# Patient Record
Sex: Female | Born: 1980
Health system: Southern US, Community
[De-identification: ages and names within clinical notes are randomized; demographics above are authoritative.]

## PROBLEM LIST (undated history)

## (undated) ENCOUNTER — Ambulatory Visit (HOSPITAL_COMMUNITY): Disposition: A | Discharge status: Home / Self Care | Payer: BC Managed Care – PPO

## (undated) DIAGNOSIS — D573 Sickle-cell trait: Secondary | ICD-10-CM

## (undated) DIAGNOSIS — D649 Anemia, unspecified: Secondary | ICD-10-CM

## (undated) DIAGNOSIS — O24419 Gestational diabetes mellitus in pregnancy, unspecified control: Secondary | ICD-10-CM

## (undated) DIAGNOSIS — E119 Type 2 diabetes mellitus without complications: Secondary | ICD-10-CM

## (undated) DIAGNOSIS — O169 Unspecified maternal hypertension, unspecified trimester: Secondary | ICD-10-CM

## (undated) HISTORY — PX: DILATION AND CURETTAGE OF UTERUS: SHX78

## (undated) HISTORY — DX: Type 2 diabetes mellitus without complications: E11.9

---
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
## 2003-01-10 ENCOUNTER — Other Ambulatory Visit: Admission: RE | Admit: 2003-01-10 | Discharge: 2003-01-10 | Payer: Self-pay | Admitting: Obstetrics & Gynecology

## 2004-02-06 ENCOUNTER — Other Ambulatory Visit: Admission: RE | Admit: 2004-02-06 | Discharge: 2004-02-06 | Payer: Self-pay | Admitting: Obstetrics & Gynecology

## 2006-09-27 ENCOUNTER — Encounter (INDEPENDENT_AMBULATORY_CARE_PROVIDER_SITE_OTHER): Payer: Self-pay | Admitting: Specialist

## 2006-09-27 ENCOUNTER — Ambulatory Visit (HOSPITAL_COMMUNITY): Admission: RE | Admit: 2006-09-27 | Discharge: 2006-09-27 | Payer: Self-pay | Admitting: Obstetrics and Gynecology

## 2007-08-12 ENCOUNTER — Ambulatory Visit: Payer: Self-pay | Admitting: Obstetrics & Gynecology

## 2007-08-12 ENCOUNTER — Ambulatory Visit (HOSPITAL_COMMUNITY): Admission: AD | Admit: 2007-08-12 | Discharge: 2007-08-13 | Payer: Self-pay | Admitting: Obstetrics & Gynecology

## 2007-08-12 ENCOUNTER — Encounter: Payer: Self-pay | Admitting: Obstetrics & Gynecology

## 2007-10-03 ENCOUNTER — Ambulatory Visit: Payer: Self-pay | Admitting: Obstetrics and Gynecology

## 2007-10-03 ENCOUNTER — Encounter: Payer: Self-pay | Admitting: Obstetrics and Gynecology

## 2008-03-12 ENCOUNTER — Emergency Department (HOSPITAL_COMMUNITY): Admission: EM | Admit: 2008-03-12 | Discharge: 2008-03-12 | Payer: Self-pay | Admitting: Family Medicine

## 2008-05-22 ENCOUNTER — Emergency Department (HOSPITAL_COMMUNITY): Admission: EM | Admit: 2008-05-22 | Discharge: 2008-05-22 | Payer: Self-pay | Admitting: Emergency Medicine

## 2010-03-10 ENCOUNTER — Encounter: Payer: Self-pay | Admitting: *Deleted

## 2010-03-15 ENCOUNTER — Emergency Department (HOSPITAL_COMMUNITY): Admission: EM | Admit: 2010-03-15 | Discharge: 2010-03-15 | Payer: Self-pay | Admitting: Family Medicine

## 2010-12-28 NOTE — Miscellaneous (Signed)
Summary: Do Not Reschedule  Missed NP appt.  Per [facility] policy is not allowed to reschedule.  Dennison Nancy RN  March 10, 2010 1:39 PM

## 2011-04-12 NOTE — Op Note (Signed)
NAMEJOICE, NAZARIO            ACCOUNT NO.:  [id number]   MEDICAL RECORD NO.:  [id number]          PATIENT TYPE:  AMB   LOCATION:  SDC                           FACILITY:  [facility]   PHYSICIAN:  Allie Bossier, MD        DATE OF BIRTH:  04-Feb-1981   DATE OF PROCEDURE:  08/12/2007  DATE OF DISCHARGE:                               OPERATIVE REPORT   PREOPERATIVE DIAGNOSIS:  Retained products after vaginal delivery.   POSTOPERATIVE DIAGNOSIS:  Retained products after vaginal delivery.   PROCEDURE:  Curettage of uterus.   SURGEON:  Allie Bossier, MD   ANESTHESIA:  Local.   COMPLICATIONS:  None.   ESTIMATED BLOOD LOSS:  400 mL.   SPECIMENS:  Retained placenta.   DETAILS OF PROCEDURE AND FINDINGS:  In the maternity admission unit,  risks, benefits, alternatives of surgery were explained, understood and  accepted.  Consents were signed. She was taken to the operating room and  given IV sedation.  She was placed in dorsal lithotomy position and a  sterile speculum was placed.  The vagina was cleared for approximately  300 cc of clots.  The anterior lip of the cervix was grasped with a ring  forceps and gentle traction was placed on the placenta that was visible.  The placenta was removed using a banjo curette and a ring forceps.  Suction was applied at the end of the procedure to assure complete  __________ of the uterus.  Gritty sensation was appreciated throughout  at the completion of the procedure.  No bleeding was noted from her  cervix.  She was given IV Pitocin and her uterus contracted nicely.  A  bimanual exam confirmed this.  Se was taken to recovery room in stable  condition with instrument, sponge, needle counts correct.      Allie Bossier, MD  Electronically Signed     MCD/MEDQ  D:  08/12/2007  T:  08/13/2007  Job:  [id number]

## 2011-04-12 NOTE — Discharge Summary (Signed)
Dawn Vaughn, Dawn Vaughn NO.:  [id number]   MEDICAL RECORD NO.:  [id number]          PATIENT TYPE:  WOC   LOCATION:  WOC                          FACILITY:  [facility]   PHYSICIAN:  Deirdre Poe, C.N.M.    DATE OF BIRTH:  02/06/1981   DATE OF ADMISSION:  10/03/2007  DATE OF DISCHARGE:                               DISCHARGE SUMMARY   The patient is here for a six-week follow up following her preterm  delivery at 20 weeks with D and C for retained products six weeks ago.  The patient is a 30 year old African-American female G1, P0 0-1-0. She  states she was unaware of her pregnancy due to having had some light  periods fairly regularly up to the time she aborted. She had no  bleeding, but noted rectal pressure, then had spontaneous rupture of  membranes and the foot was protruding from the vagina. She expelled the  fetus at home and did have a D and C as above. She was sent home with a  NuvaRing and she is satisfied with that. She denies any abnormal  bleeding. Her LMP in October was normal.  She is unsure of the date. She  is pleased with the NuvaRing and has one partner mutually monogamous.   ALLERGIES:  None.   IMMUNIZATIONS:  Usual childhood.   MENSES:  15 x 30 x 7. Mild dysmenorrhea.   CONTRACEPTIVE HISTORY:  None other than NuvaRing.   OB HISTORY:  Above.   GYN HISTORY:  Never had an abnormal Pap. Last was normal about one year  ago.   PAST SURGICAL HISTORY:  No surgeries except D and C.   FAMILY HISTORY:  Mother diabetes and hypertension.   PAST MEDICAL HISTORY:  Noncontributory.   SOCIAL HISTORY:  Lives with mother and sister. Works at a call center in  the evenings. Nonsmoker. Drinks small amounts of alcohol. No caffeine.  No illicit and no sexual or physical abuse.   REVIEW OF SYSTEMS:  Is essentially negative.   PHYSICAL EXAMINATION:  Pulse 77. BP 123/71. Weight 151. Height 5 feet 2.  No apparent distress. HEART: RRR without murmurs. LUNGS: Clear  to  auscultation. ABDOMEN: Soft, flat and nontender. PELVIC: NEFG. BUS  negative. Vagina pink, well rugated. Cervix posterior nulliparous.  Nabothian cyst at 10 and 2:00 o'clock about a half cm. No other lesions,  discharge physiologic. Bimanual uterus introverted normal shape. Adnexa  no tenderness and no masses.   ASSESSMENT:  Doing well after her 20-week SAB. This history is  suspicious for cervical weakness.   PLAN:  Pap, GC and Chlamydia were done. The patient is counseled to keep  a menstrual calendar, to begin vitamins with folic acid before she  becomes pregnant.  She is urged to come as early as possible for an  initial prenatal visit as soon as pregnancy is diagnosed to be assessed  for possible cerclage for 17 hydroxy P. Continue on NuvaRing.      Caren Griffins, C.N.M.     DP/MEDQ  D:  10/03/2007  T:  10/04/2007  Job:  [id number]

## 2011-04-15 NOTE — Op Note (Signed)
Dawn Vaughn, Dawn Vaughn            ACCOUNT NO.:  [id number]   MEDICAL RECORD NO.:  [id number]          PATIENT TYPE:  AMB   LOCATION:  SDC                           FACILITY:  [facility]   PHYSICIAN:  Maxie Better, M.D.DATE OF BIRTH:  February 07, 1981   DATE OF PROCEDURE:  09/27/2006  DATE OF DISCHARGE:                                 OPERATIVE REPORT   PREOPERATIVE DIAGNOSIS:  Elective termination.   PROCEDURE:  Suction, dilation and evacuation.   POSTOPERATIVE DIAGNOSIS:  Elective termination.   ANESTHESIA:  MAC, paracervical block.   SURGEON:  Maxie Better, M.D.   PROCEDURE:  Under adequate monitored anesthesia, the patient was placed in  the dorsal lithotomy position.  She was sterilely prepped and draped in the  usual fashion.  The bladder was catheterized for a moderate amount of urine.  Examination under anesthesia revealed an 8- to 10-week-size uterus.  No  adnexal masses could be appreciated.  A bivalve speculum was placed in the  vagina.  Twenty milliliters of 1%  Nesacaine were injected paracervically at  3 and 9 o'clock.  The anterior lip of the cervix was grasped with a single-  tooth tenaculum.  The cervix was serially dilated up to a #29 Pratt dilator.  A #8-mm curved suction cannula was then introduced into the uterine cavity;  a large amount of products of conception were obtained.  The cavity was  suction, then curetted and suctioned; brisk bleeding was noted during the  procedure and therefore Methergine intramuscularly was given to the patient  by Anesthesia as per my request.  The procedure was then continued until all  the tissue was felt to have been removed.  The bivalve speculum was removed.  The uterus was bimanually massaged and the cavity once again was suctioned  after the bivalve speculum was reinserted.  When hemostasis was noted, all  instruments were then removed from the vagina.  Specimen labeled products of  conception was sent to Pathology.   Estimated blood loss was about 150 mL.  Complication was none.  The patient tolerated the procedure well and was  transferred to recovery room in stable condition.      Maxie Better, M.D.  Electronically Signed     [place]/MEDQ  D:  09/27/2006  T:  09/28/2006  Job:  [id number]

## 2011-09-09 LAB — URINALYSIS, ROUTINE W REFLEX MICROSCOPIC
Glucose, UA: NEGATIVE
Leukocytes, UA: NEGATIVE
Nitrite: NEGATIVE
Protein, ur: NEGATIVE
pH: 6.5

## 2011-09-09 LAB — DIFFERENTIAL
Basophils Absolute: 0
Basophils Relative: 0
Eosinophils Absolute: 0
Eosinophils Relative: 0
Lymphocytes Relative: 7 — ABNORMAL LOW
Lymphs Abs: 0.9
Monocytes Absolute: 0.6
Monocytes Relative: 5
Neutro Abs: 10.8 — ABNORMAL HIGH
Neutrophils Relative %: 88 — ABNORMAL HIGH

## 2011-09-09 LAB — CBC
HCT: 38.2
Hemoglobin: 12.9
MCHC: 33.7
MCV: 79.9
Platelets: 158
RBC: 4.78
RDW: 13.6
WBC: 12.3 — ABNORMAL HIGH

## 2011-09-09 LAB — URINE MICROSCOPIC-ADD ON

## 2011-09-09 LAB — RUBELLA SCREEN: Rubella: 281.6 — ABNORMAL HIGH

## 2011-09-09 LAB — RPR: RPR Ser Ql: NONREACTIVE

## 2011-09-09 LAB — TYPE AND SCREEN: Antibody Screen: NEGATIVE

## 2011-09-09 LAB — RAPID URINE DRUG SCREEN, HOSP PERFORMED: Benzodiazepines: NOT DETECTED

## 2016-10-02 ENCOUNTER — Encounter (HOSPITAL_COMMUNITY): Payer: Self-pay | Admitting: Family Medicine

## 2016-10-02 ENCOUNTER — Ambulatory Visit (HOSPITAL_COMMUNITY)
Admission: EM | Admit: 2016-10-02 | Discharge: 2016-10-02 | Disposition: A | Discharge status: Home / Self Care | Payer: BLUE CROSS/BLUE SHIELD | Attending: Emergency Medicine | Admitting: Emergency Medicine

## 2016-10-02 ENCOUNTER — Ambulatory Visit (INDEPENDENT_AMBULATORY_CARE_PROVIDER_SITE_OTHER): Payer: BLUE CROSS/BLUE SHIELD

## 2016-10-02 DIAGNOSIS — S93431A Sprain of tibiofibular ligament of right ankle, initial encounter: Secondary | ICD-10-CM

## 2016-10-02 DIAGNOSIS — S93491A Sprain of other ligament of right ankle, initial encounter: Secondary | ICD-10-CM

## 2016-10-02 NOTE — ED Triage Notes (Signed)
Pt here for right ankle pain and swelling x 1 week after slip and fall. Swelling in ankle and foot,.

## 2016-10-02 NOTE — Discharge Instructions (Signed)
You have a high ankle sprain. These tend to take a little longer to heal. Wear the Cam Walker boot when you are moving around. Otherwise, continue to elevate your foot when possible, apply ice, take ibuprofen for pain. If this is not improving in 1 week, follow-up with Dr. Shon BatonBrooks in orthopedics.

## 2016-10-02 NOTE — ED Provider Notes (Signed)
[facility]    CSN: [id number] Arrival date & time: 10/02/16  1832     History   Chief Complaint Chief Complaint  Patient presents with  . Ankle Pain    HPI Dawn Vaughn is a 35 y.o. female.   HPI  She is a 35 year old woman here for evaluation of right ankle pain. She slipped on a wet sidewalk one week ago, wrenching her right ankle. She has been doing elevation, ice, and icy hot without much improvement. Pain is located across the top of her ankle. She's had persistent swelling that goes into the foot. She is able to bear weight.  History reviewed. No pertinent past medical history.  There are no active problems to display for this patient.   History reviewed. No pertinent surgical history.  OB History    No data available       Home Medications    Prior to Admission medications   Not on File    Family History History reviewed. No pertinent family history.  Social History Social History  Substance Use Topics  . Smoking status: Never Smoker  . Smokeless tobacco: Never Used  . Alcohol use Not on file     Allergies   Patient has no known allergies.   Review of Systems Review of Systems As in history of present illness  Physical Exam Triage Vital Signs ED Triage Vitals  Enc Vitals Group     BP 10/02/16 1856 143/95     Pulse Rate 10/02/16 1856 90     Resp 10/02/16 1856 18     Temp 10/02/16 1856 98.1 F (36.7 C)     Temp Source 10/02/16 1856 Oral     SpO2 10/02/16 1856 99 %     Weight --      Height --      Head Circumference --      Peak Flow --      Pain Score 10/02/16 1857 6     Pain Loc --      Pain Edu? --      Excl. in GC? --    No data found.   Updated Vital Signs BP 143/95   Pulse 90   Temp 98.1 F (36.7 C) (Oral)   Resp 18   LMP 09/07/2016   SpO2 99%   Visual Acuity Right Eye Distance:   Left Eye Distance:   Bilateral Distance:    Right Eye Near:   Left Eye Near:    Bilateral Near:     Physical  Exam  Constitutional: She is oriented to person, place, and time. She appears well-developed and well-nourished. No distress.  Cardiovascular: Normal rate.   Pulmonary/Chest: Effort normal.  Musculoskeletal:  Right ankle: There is swelling the anterior aspect of the ankle. Tenderness around the ankle and over the syndesmosis. 2+ DP pulse. Normal range of motion.  Neurological: She is alert and oriented to person, place, and time.     UC Treatments / Results  Labs (all labs ordered are listed, but only abnormal results are displayed) Labs Reviewed - No data to display  EKG  EKG Interpretation None       Radiology Dg Ankle Complete Right  Result Date: 10/02/2016 CLINICAL DATA:  Tripped and fell 6 days ago. Persistent right ankle pain and swelling. Initial encounter. EXAM: RIGHT ANKLE - COMPLETE 3+ VIEW COMPARISON:  None. FINDINGS: There is no evidence of fracture, dislocation, or joint effusion. There is no evidence of arthropathy or other  focal bone abnormality. Soft tissues are unremarkable. IMPRESSION: Negative. Electronically Signed   By: Myles RosenthalJohn  Stahl M.D.   On: 10/02/2016 19:32   Dg Foot Complete Right  Result Date: 10/02/2016 CLINICAL DATA:  Larey SeatFell 6 days ago and injured right foot. EXAM: RIGHT FOOT COMPLETE - 3+ VIEW COMPARISON:  None. FINDINGS: The joint spaces are maintained.  No acute fracture is identified. IMPRESSION: No acute fracture. Electronically Signed   By: Rudie MeyerP.  Gallerani M.D.   On: 10/02/2016 19:28    Procedures Procedures (including critical care time)  Medications Ordered in UC Medications - No data to display   Initial Impression / Assessment and Plan / UC Course  I have reviewed the triage vital signs and the nursing notes.  Pertinent labs & imaging results that were available during my care of the patient were reviewed by me and considered in my medical decision making (see chart for details).  Clinical Course     Cam Conservation officer, natureWalker boot. Continue ice,  elevation. Add ibuprofen for pain. Follow-up with orthopedics if not improving in 1 week.  Final Clinical Impressions(s) / UC Diagnoses   Final diagnoses:  High ankle sprain of right lower extremity, initial encounter    New Prescriptions There are no discharge medications for this patient.    Charm RingsErin J Donovon Micheletti, MD 10/02/16 [phone]

## 2017-03-13 ENCOUNTER — Other Ambulatory Visit: Payer: Self-pay | Admitting: Obstetrics and Gynecology

## 2017-03-13 DIAGNOSIS — N6310 Unspecified lump in the right breast, unspecified quadrant: Secondary | ICD-10-CM

## 2017-03-21 ENCOUNTER — Other Ambulatory Visit: Payer: BLUE CROSS/BLUE SHIELD

## 2017-03-21 ENCOUNTER — Ambulatory Visit
Admission: RE | Admit: 2017-03-21 | Discharge: 2017-03-21 | Disposition: A | Discharge status: Home / Self Care | Payer: BLUE CROSS/BLUE SHIELD | Source: Ambulatory Visit | Attending: Obstetrics and Gynecology | Admitting: Obstetrics and Gynecology

## 2017-03-21 ENCOUNTER — Other Ambulatory Visit: Payer: Self-pay | Admitting: Obstetrics and Gynecology

## 2017-03-21 DIAGNOSIS — N6310 Unspecified lump in the right breast, unspecified quadrant: Secondary | ICD-10-CM

## 2017-03-21 DIAGNOSIS — N6320 Unspecified lump in the left breast, unspecified quadrant: Secondary | ICD-10-CM

## 2017-03-22 ENCOUNTER — Other Ambulatory Visit: Payer: Self-pay | Admitting: Obstetrics and Gynecology

## 2017-03-22 DIAGNOSIS — N6320 Unspecified lump in the left breast, unspecified quadrant: Secondary | ICD-10-CM

## 2017-03-23 ENCOUNTER — Ambulatory Visit
Admission: RE | Admit: 2017-03-23 | Discharge: 2017-03-23 | Disposition: A | Discharge status: Home / Self Care | Payer: BLUE CROSS/BLUE SHIELD | Source: Ambulatory Visit | Attending: Obstetrics and Gynecology | Admitting: Obstetrics and Gynecology

## 2017-03-23 DIAGNOSIS — N6320 Unspecified lump in the left breast, unspecified quadrant: Secondary | ICD-10-CM

## 2018-11-26 ENCOUNTER — Other Ambulatory Visit: Payer: Self-pay

## 2018-11-26 ENCOUNTER — Encounter (HOSPITAL_BASED_OUTPATIENT_CLINIC_OR_DEPARTMENT_OTHER): Payer: Self-pay | Admitting: *Deleted

## 2018-11-26 ENCOUNTER — Emergency Department (HOSPITAL_BASED_OUTPATIENT_CLINIC_OR_DEPARTMENT_OTHER)
Admission: EM | Admit: 2018-11-26 | Discharge: 2018-11-26 | Disposition: A | Discharge status: Home / Self Care | Payer: BLUE CROSS/BLUE SHIELD | Attending: Emergency Medicine | Admitting: Emergency Medicine

## 2018-11-26 DIAGNOSIS — H1031 Unspecified acute conjunctivitis, right eye: Secondary | ICD-10-CM | POA: Diagnosis not present

## 2018-11-26 DIAGNOSIS — H5789 Other specified disorders of eye and adnexa: Secondary | ICD-10-CM | POA: Diagnosis present

## 2018-11-26 LAB — PREGNANCY, URINE: Preg Test, Ur: NEGATIVE

## 2018-11-26 MED ORDER — POLYMYXIN B-TRIMETHOPRIM 10000-0.1 UNIT/ML-% OP SOLN: 2.0000 [drp] | OPHTHALMIC | 0 refills | Status: AC

## 2018-11-26 MED FILL — POLYMYXIN B/TMP EYE DROPS: 10000-0.1 | 17 days supply | Qty: 10 | Fill #0

## 2018-11-26 NOTE — ED Triage Notes (Signed)
She woke this am with redness, itching and drainage from her right eye.

## 2018-11-26 NOTE — ED Notes (Signed)
ED Provider at bedside. 

## 2018-11-26 NOTE — ED Provider Notes (Signed)
Emergency Department Provider Note   I have reviewed the triage vital signs and the nursing notes.   HISTORY  Chief Complaint Eye Problem   HPI Dawn Vaughn is a 37 y.o. female presents to the emergency department for evaluation of right eye redness, itching, drainage.  Symptoms began this morning.  She denies any specific eye pain or vision change.  She has some mild nasal congestion but denies sore throat or flulike symptoms.  The discharge this morning was somewhat thick.  No shortness of breath. No HA.    History reviewed. No pertinent past medical history.  There are no active problems to display for this patient.   History reviewed. No pertinent surgical history.    Allergies Patient has no known allergies.  No family history on file.  Social History Social History   Tobacco Use  . Smoking status: Never Smoker  . Smokeless tobacco: Never Used  Substance Use Topics  . Alcohol use: Yes  . Drug use: Never    Review of Systems  Constitutional: No fever/chills Eyes: Right eye redness, drainage, and itching.  ENT: No sore throat. Musculoskeletal: Negative for back pain. Skin: Negative for rash. Neurological: Negative for headaches, focal weakness or numbness.  10-point ROS otherwise negative.  ____________________________________________   PHYSICAL EXAM:  VITAL SIGNS: ED Triage Vitals  Enc Vitals Group     BP 11/26/18 1252 (!) 150/96     Pulse Rate 11/26/18 1252 86     Resp 11/26/18 1252 18     Temp 11/26/18 1252 98.5 F (36.9 C)     Temp Source 11/26/18 1252 Oral     SpO2 11/26/18 1252 96 %     Weight 11/26/18 1255 218 lb 14.7 oz (99.3 kg)     Height 11/26/18 1251 5\' 3"  (1.6 m)     Pain Score 11/26/18 1251 0   Constitutional: Alert and oriented. Well appearing and in no acute distress. Eyes: Conjunctivae are injected on the right with clear discharge.  Head: Atraumatic. Nose: No congestion/rhinnorhea. Mouth/Throat: Mucous membranes  are moist.   Neck: No stridor.  Cardiovascular: Normal rate, regular rhythm. Respiratory: Normal respiratory effort.  Gastrointestinal:  No distention.   ____________________________________________   LABS (all labs ordered are listed, but only abnormal results are displayed)  Labs Reviewed  PREGNANCY, URINE   ____________________________________________   PROCEDURES  Procedure(s) performed:   Procedures  None ____________________________________________   INITIAL IMPRESSION / ASSESSMENT AND PLAN / ED COURSE  Pertinent labs & imaging results that were available during my care of the patient were reviewed by me and considered in my medical decision making (see chart for details).  Patient presents to the emergency department with right eye redness and watery drainage.  Drainage is more thick this morning.  Plan for Polytrim drops and follow-up with ophthalmology.  Patient does wear contact lenses but is not experiencing any eye pain.  Doubt corneal abrasion.  She will not wear the contacts until this resolves.    ____________________________________________  FINAL CLINICAL IMPRESSION(S) / ED DIAGNOSES  Final diagnoses:  Acute conjunctivitis of right eye, unspecified acute conjunctivitis type    NEW OUTPATIENT MEDICATIONS STARTED DURING THIS VISIT:  Discharge Medication List as of 11/26/2018  2:21 PM    START taking these medications   Details  trimethoprim-polymyxin b (POLYTRIM) ophthalmic solution Place 2 drops into the right eye every 4 (four) hours for 7 days., Starting Mon 11/26/2018, Until Mon 12/03/2018, Print  Note:  This document was prepared using Dragon voice recognition software and may include unintentional dictation errors.  Alona BeneJoshua Long, MD Emergency Medicine    Long, Arlyss RepressJoshua G, MD 11/27/18 [phone]

## 2018-11-26 NOTE — Discharge Instructions (Signed)
You have an eye infection called conjunctivitis.  This can be caused by a viral or bacterial infection, but we treat with antibiotics either way to be safe.  Please use the provided antibiotics or fill the provided prescription and use as directed.  Follow up as indicated on your instructions.  Return to the Emergency Department if your symptoms worsen in spite of treatment or if you develop new symptoms that concern you. ° °

## 2021-03-29 ENCOUNTER — Ambulatory Visit (HOSPITAL_COMMUNITY)
Admission: EM | Admit: 2021-03-29 | Discharge: 2021-03-29 | Disposition: A | Discharge status: Home / Self Care | Payer: BC Managed Care – PPO | Attending: Physician Assistant | Admitting: Physician Assistant

## 2021-03-29 ENCOUNTER — Encounter (HOSPITAL_COMMUNITY): Payer: Self-pay

## 2021-03-29 ENCOUNTER — Other Ambulatory Visit: Payer: Self-pay

## 2021-03-29 DIAGNOSIS — H1032 Unspecified acute conjunctivitis, left eye: Secondary | ICD-10-CM | POA: Diagnosis not present

## 2021-03-29 DIAGNOSIS — J302 Other seasonal allergic rhinitis: Secondary | ICD-10-CM | POA: Diagnosis present

## 2021-03-29 DIAGNOSIS — J029 Acute pharyngitis, unspecified: Secondary | ICD-10-CM | POA: Insufficient documentation

## 2021-03-29 LAB — POCT RAPID STREP A, ED / UC: Streptococcus, Group A Screen (Direct): NEGATIVE

## 2021-03-29 MED ORDER — TRIAMCINOLONE ACETONIDE 40 MG/ML IJ SUSP
INTRAMUSCULAR | Status: AC
  Filled 2021-03-29: qty 1

## 2021-03-29 MED ORDER — TRIAMCINOLONE ACETONIDE 40 MG/ML IJ SUSP
40.0000 mg | Freq: Once | INTRAMUSCULAR | Status: AC
  Administered 2021-03-29: 40 mg via INTRAMUSCULAR

## 2021-03-29 MED ORDER — OFLOXACIN 0.3 % OP SOLN: 1.0000 [drp] | Freq: Four times a day (QID) | OPHTHALMIC | 0 refills | Status: DC

## 2021-03-29 NOTE — Discharge Instructions (Addendum)
Use eyedrops 4 times a day.  You can use lubricating eyedrops for symptom relief.  Do not wear contact lenses until your eye has returned to normal and you have no discharge for at least 24 hours.  We gave you a shot of steroids to help with your symptoms.  Please continue over-the-counter allergy medication as previously prescribed.  If your symptoms persist or worsen you need to return for reevaluation.

## 2021-03-29 NOTE — ED Triage Notes (Signed)
Pt reports difficulty swallowing x 5 days; green crust in left eye since this morning. Denies trouble breathing, throat tightness.

## 2021-03-29 NOTE — ED Provider Notes (Signed)
[facility]    CSN: [id number] Arrival date & time: 03/29/21  0920      History   Chief Complaint Chief Complaint  Patient presents with  . Eye Problem    HPI Dawn Vaughn is a 40 y.o. female.   Patient presents today with a 5-day history of nasal congestion.  Reports associated cough, sore throat, drainage, sinus pressure, headache.  Denies any chest pain, shortness of breath, fever, nausea, vomiting.  She has tried Claritin and TheraFlu without improvement of symptoms.  She denies any known sick contacts.  Reports today she woke up with irritation of her left eye and associated purulent drainage.  She cleaned eye with warm rag and used Visine with improvement but not resolution of symptoms.  She does wear contact lenses but has remove these and is now wearing glasses.  She does have a history of allergies but denies any asthma, COPD, smoking.  She does not receive flu shot but is up-to-date on COVID vaccines.  Denies any recent antibiotics.  Denies any vision changes or foreign body sensation.     History reviewed. No pertinent past medical history.  There are no problems to display for this patient.   History reviewed. No pertinent surgical history.  OB History   No obstetric history on file.      Home Medications    Prior to Admission medications   Medication Sig Start Date End Date Taking? Authorizing Provider  levocetirizine (XYZAL) 5 MG tablet Take 5 mg by mouth every evening.   Yes [provider]  ofloxacin (OCUFLOX) 0.3 % ophthalmic solution Place 1 drop into the left eye 4 (four) times daily. 03/29/21  Yes Kaetlin Bullen, Noberto Retort, PA-C    Family History History reviewed. No pertinent family history.  Social History Social History   Tobacco Use  . Smoking status: Never Smoker  . Smokeless tobacco: Never Used  Substance Use Topics  . Alcohol use: Yes  . Drug use: Never     Allergies   Patient has no known allergies.   Review of  Systems Review of Systems  Constitutional: Positive for activity change. Negative for appetite change, fatigue and fever.  HENT: Positive for congestion and sore throat. Negative for sinus pressure and sneezing.   Eyes: Positive for discharge, redness and itching. Negative for visual disturbance.  Respiratory: Positive for cough. Negative for shortness of breath.   Cardiovascular: Negative for chest pain.  Gastrointestinal: Negative for abdominal pain, diarrhea, nausea and vomiting.  Musculoskeletal: Negative for arthralgias and myalgias.  Neurological: Positive for headaches. Negative for dizziness and light-headedness.     Physical Exam Triage Vital Signs ED Triage Vitals  Enc Vitals Group     BP 03/29/21 0946 (!) 142/98     Pulse Rate 03/29/21 0946 87     Resp 03/29/21 0946 19     Temp 03/29/21 0946 98.5 F (36.9 C)     Temp Source 03/29/21 0946 Oral     SpO2 --      Weight --      Height --      Head Circumference --      Peak Flow --      Pain Score 03/29/21 0944 0     Pain Loc --      Pain Edu? --      Excl. in GC? --    No data found.  Updated Vital Signs BP (!) 142/98 (BP Location: Right Arm)   Pulse 87  Temp 98.5 F (36.9 C) (Oral)   Resp 19   LMP  (Within Weeks) Comment: 2 weeks  Visual Acuity Right Eye Distance:   Left Eye Distance:   Bilateral Distance:    Right Eye Near:   Left Eye Near:    Bilateral Near:     Physical Exam Vitals reviewed.  Constitutional:      General: She is awake. She is not in acute distress.    Appearance: Normal appearance. She is not ill-appearing.     Comments: Very pleasant female appears stated age in no acute distress  HENT:     Head: Normocephalic and atraumatic.     Right Ear: Tympanic membrane, ear canal and external ear normal. Tympanic membrane is not erythematous or bulging.     Left Ear: Tympanic membrane, ear canal and external ear normal. Tympanic membrane is not erythematous or bulging.     Nose:      Right Sinus: Maxillary sinus tenderness present. No frontal sinus tenderness.     Left Sinus: Maxillary sinus tenderness present. No frontal sinus tenderness.     Mouth/Throat:     Pharynx: Uvula midline. Posterior oropharyngeal erythema present. No oropharyngeal exudate.     Comments: Moderate erythema and drainage in posterior oropharynx Eyes:     Extraocular Movements: Extraocular movements intact.     Conjunctiva/sclera:     Left eye: Left conjunctiva is injected.     Pupils: Pupils are equal, round, and reactive to light.  Cardiovascular:     Rate and Rhythm: Normal rate and regular rhythm.     Heart sounds: No murmur heard.   Pulmonary:     Effort: Pulmonary effort is normal.     Breath sounds: Normal breath sounds. No wheezing, rhonchi or rales.     Comments: Clear to auscultation bilaterally Abdominal:     Palpations: Abdomen is soft.     Tenderness: There is no abdominal tenderness.  Lymphadenopathy:     Head:     Right side of head: No submental, submandibular or tonsillar adenopathy.     Left side of head: No submental, submandibular or tonsillar adenopathy.     Cervical: No cervical adenopathy.  Psychiatric:        Behavior: Behavior is cooperative.      UC Treatments / Results  Labs (all labs ordered are listed, but only abnormal results are displayed) Labs Reviewed  CULTURE, GROUP A STREP [facility])  POCT RAPID STREP A, ED / UC    EKG   Radiology No results found.  Procedures Procedures (including critical care time)  Medications Ordered in UC Medications  triamcinolone acetonide (KENALOG-40) injection 40 mg (has no administration in time range)    Initial Impression / Assessment and Plan / UC Course  I have reviewed the triage vital signs and the nursing notes.  Pertinent labs & imaging results that were available during my care of the patient were reviewed by me and considered in my medical decision making (see chart for details).     Rapid  strep negative in office today, throat culture results pending.  No indication for antibiotic treatment based on physical exam today.  Suspect allergies as etiology patient was encouraged to continue previously prescribed medication.  She was given Kenalog injection during visit today.  Discussed that if symptoms persist or worsen she is to return for reevaluation.  Strict return precautions given to which patient expressed understanding.  Will cover for bacterial conjunctivitis given unilateral symptoms and patient wears  contacts.  She was given ofloxacin to be used 4 times daily.  Recommended she avoid contact use until symptoms improve.  She return precautions given to which patient expressed understanding. Final Clinical Impressions(s) / UC Diagnoses   Final diagnoses:  Seasonal allergies  Acute bacterial conjunctivitis of left eye  Sore throat     Discharge Instructions     Use eyedrops 4 times a day.  You can use lubricating eyedrops for symptom relief.  Do not wear contact lenses until your eye has returned to normal and you have no discharge for at least 24 hours.  We gave you a shot of steroids to help with your symptoms.  Please continue over-the-counter allergy medication as previously prescribed.  If your symptoms persist or worsen you need to return for reevaluation.    ED Prescriptions    Medication Sig Dispense Auth. Provider   ofloxacin (OCUFLOX) 0.3 % ophthalmic solution Place 1 drop into the left eye 4 (four) times daily. 5 mL Jarmon Javid K, PA-C     PDMP not reviewed this encounter.   Jeani Hawking, PA-C 03/29/21 1136

## 2021-03-30 LAB — CULTURE, GROUP A STREP (THRC)

## 2021-03-31 LAB — CULTURE, GROUP A STREP (THRC)

## 2021-09-20 LAB — HEPATITIS C ANTIBODY: HCV Ab: NEGATIVE

## 2021-09-20 LAB — OB RESULTS CONSOLE ANTIBODY SCREEN: Antibody Screen: NEGATIVE

## 2021-09-20 LAB — OB RESULTS CONSOLE RUBELLA ANTIBODY, IGM: Rubella: IMMUNE

## 2021-09-20 LAB — OB RESULTS CONSOLE ABO/RH: RH Type: POSITIVE

## 2021-09-20 LAB — OB RESULTS CONSOLE HIV ANTIBODY (ROUTINE TESTING): HIV: NONREACTIVE

## 2021-09-20 LAB — OB RESULTS CONSOLE HEPATITIS B SURFACE ANTIGEN: Hepatitis B Surface Ag: NEGATIVE

## 2021-09-20 LAB — OB RESULTS CONSOLE RPR: RPR: NONREACTIVE

## 2021-09-24 ENCOUNTER — Ambulatory Visit: Payer: Self-pay | Admitting: Family

## 2021-09-24 ENCOUNTER — Other Ambulatory Visit: Payer: Self-pay

## 2021-10-05 ENCOUNTER — Other Ambulatory Visit: Payer: Self-pay | Admitting: Family

## 2021-10-05 ENCOUNTER — Ambulatory Visit: Payer: BC Managed Care – PPO | Admitting: Family

## 2021-11-10 ENCOUNTER — Other Ambulatory Visit: Payer: Self-pay

## 2021-11-15 ENCOUNTER — Encounter (HOSPITAL_COMMUNITY): Payer: Self-pay | Admitting: *Deleted

## 2021-11-15 ENCOUNTER — Emergency Department (HOSPITAL_COMMUNITY): Admission: EM | Admit: 2021-11-15 | Payer: BC Managed Care – PPO | Source: Home / Self Care

## 2021-11-15 ENCOUNTER — Other Ambulatory Visit: Payer: Self-pay | Admitting: Obstetrics and Gynecology

## 2021-11-15 ENCOUNTER — Other Ambulatory Visit: Payer: Self-pay

## 2021-11-15 ENCOUNTER — Encounter (HOSPITAL_COMMUNITY): Payer: Self-pay | Admitting: Obstetrics and Gynecology

## 2021-11-15 ENCOUNTER — Telehealth (HOSPITAL_COMMUNITY): Payer: Self-pay | Admitting: *Deleted

## 2021-11-15 ENCOUNTER — Inpatient Hospital Stay (HOSPITAL_COMMUNITY)
Admission: AD | Admit: 2021-11-15 | Discharge: 2021-11-15 | Disposition: A | Discharge status: Home / Self Care | Payer: BC Managed Care – PPO | Attending: Obstetrics and Gynecology | Admitting: Obstetrics and Gynecology

## 2021-11-15 DIAGNOSIS — O09521 Supervision of elderly multigravida, first trimester: Secondary | ICD-10-CM | POA: Insufficient documentation

## 2021-11-15 DIAGNOSIS — Z3A13 13 weeks gestation of pregnancy: Secondary | ICD-10-CM | POA: Insufficient documentation

## 2021-11-15 DIAGNOSIS — O26852 Spotting complicating pregnancy, second trimester: Secondary | ICD-10-CM | POA: Diagnosis not present

## 2021-11-15 DIAGNOSIS — O26851 Spotting complicating pregnancy, first trimester: Secondary | ICD-10-CM | POA: Insufficient documentation

## 2021-11-15 HISTORY — DX: Anemia, unspecified: D64.9

## 2021-11-15 HISTORY — DX: Sickle-cell trait: D57.3

## 2021-11-15 NOTE — Telephone Encounter (Signed)
Preadmission screen  

## 2021-11-15 NOTE — MAU Provider Note (Signed)
History     CSN: [id number]  Arrival date and time: 11/15/21 0909   Event Date/Time   First Provider Initiated Contact with Patient 11/15/21 1019      No chief complaint on file.  HPI This is a 40 yo G2P0100 at [redacted]w[redacted]d who presents with spotting that started earlier today.  Since that time, she has not noticed any further spotting.  She has not needed to put on a pad.  No provoking or palliating factors.    OB History     Gravida  2   Para  1   Term      Preterm  1   AB      Living  0      SAB      IAB      Ectopic      Multiple      Live Births  0        Obstetric Comments  With first did not know she was preg, was still having "periods" every month; D&C for retained placenta         Past Medical History:  Diagnosis Date   Anemia    Sickle cell trait (HCC)     Past Surgical History:  Procedure Laterality Date   DILATION AND CURETTAGE OF UTERUS      Family History  Problem Relation Age of Onset   Hypertension Mother    Diabetes Mother    Heart disease Mother    Healthy Father     Social History   Tobacco Use   Smoking status: Never   Smokeless tobacco: Never  Vaping Use   Vaping Use: Never used  Substance Use Topics   Alcohol use: Not Currently   Drug use: Never    Allergies: No Known Allergies  Medications Prior to Admission  Medication Sig Dispense Refill Last Dose   Prenatal Vit-Fe Fumarate-FA (PRENATAL VITAMIN PO) Take by mouth.   11/14/2021   levocetirizine (XYZAL) 5 MG tablet Take 5 mg by mouth every evening.   More than a month   ofloxacin (OCUFLOX) 0.3 % ophthalmic solution Place 1 drop into the left eye 4 (four) times daily. 5 mL 0     Review of Systems Physical Exam   Blood pressure 140/86, pulse 87, temperature 98.2 F (36.8 C), temperature source Oral, resp. rate 16, height 5\' 3"  (1.6 m), weight 109.8 kg, SpO2 97 %.  Physical Exam Vitals reviewed.  Constitutional:      Appearance: Normal appearance.   Cardiovascular:     Rate and Rhythm: Normal rate and regular rhythm.  Pulmonary:     Effort: Pulmonary effort is normal.     Breath sounds: Normal breath sounds.  Abdominal:     General: Abdomen is flat.     Palpations: Abdomen is soft.  Skin:    General: Skin is warm and dry.     Capillary Refill: Capillary refill takes less than 2 seconds.  Neurological:     General: No focal deficit present.     Mental Status: She is alert.  Psychiatric:        Mood and Affect: Mood normal.        Behavior: Behavior normal.        Thought Content: Thought content normal.        Judgment: Judgment normal.    MAU Course  Procedures  Pt informed that the ultrasound is considered a limited OB ultrasound and is not intended to be a complete  ultrasound exam.  Patient also informed that the ultrasound is not being completed with the intent of assessing for fetal or placental anomalies or any pelvic abnormalities.  Explained that the purpose of todays ultrasound is to assess for  viability.  Patient acknowledges the purpose of the exam and the limitations of the study.    CRL measuring [redacted]w[redacted]d. FHR 164. Placenta appears normal and without subchorionic hematoma or hemorrhage.  MDM   Assessment and Plan   1. [redacted] weeks gestation of pregnancy   2. Spotting affecting pregnancy in second trimester    Discharge to home. Pelvic rest. F/u with primary OB.  Dawn Vaughn 11/15/2021, 10:19 AM

## 2021-11-15 NOTE — MAU Note (Signed)
Spotting this morning, no recent intercourse, no pain.  No prior bleeding this preg.

## 2021-11-15 NOTE — MAU Note (Signed)
Dawn Vaughn is a 40 y.o. at [redacted]w[redacted]d here in MAU reporting: this AM when she got up she went to the bathroom she saw some bleeding when she wiped. No pain. No recent IC.  Onset of complaint: today  Pain score: 0/10  Vitals:   11/15/21 0940  BP: (!) 141/76  Pulse: 85  Resp: 16  Temp: 98.2 F (36.8 C)  SpO2: 97%     FHT: attempted  Lab orders placed from triage: none

## 2021-11-17 ENCOUNTER — Other Ambulatory Visit (HOSPITAL_COMMUNITY): Admission: RE | Admit: 2021-11-17 | Payer: BC Managed Care – PPO | Source: Ambulatory Visit

## 2021-11-19 ENCOUNTER — Inpatient Hospital Stay (HOSPITAL_COMMUNITY): Payer: BC Managed Care – PPO | Admitting: Anesthesiology

## 2021-11-19 ENCOUNTER — Encounter (HOSPITAL_COMMUNITY): Payer: Self-pay | Admitting: Obstetrics and Gynecology

## 2021-11-19 ENCOUNTER — Observation Stay (HOSPITAL_COMMUNITY)
Admission: RE | Admit: 2021-11-19 | Discharge: 2021-11-19 | Disposition: A | Discharge status: Home / Self Care | Payer: BC Managed Care – PPO | Source: Ambulatory Visit | Attending: Obstetrics and Gynecology | Admitting: Obstetrics and Gynecology

## 2021-11-19 ENCOUNTER — Encounter (HOSPITAL_COMMUNITY)
Admission: RE | Disposition: A | Discharge status: Home / Self Care | Payer: Self-pay | Source: Ambulatory Visit | Attending: Obstetrics and Gynecology

## 2021-11-19 ENCOUNTER — Other Ambulatory Visit: Payer: Self-pay

## 2021-11-19 DIAGNOSIS — O09299 Supervision of pregnancy with other poor reproductive or obstetric history, unspecified trimester: Secondary | ICD-10-CM

## 2021-11-19 DIAGNOSIS — O2622 Pregnancy care for patient with recurrent pregnancy loss, second trimester: Secondary | ICD-10-CM | POA: Diagnosis present

## 2021-11-19 DIAGNOSIS — Z20822 Contact with and (suspected) exposure to covid-19: Secondary | ICD-10-CM | POA: Insufficient documentation

## 2021-11-19 DIAGNOSIS — E669 Obesity, unspecified: Secondary | ICD-10-CM | POA: Diagnosis not present

## 2021-11-19 DIAGNOSIS — O99212 Obesity complicating pregnancy, second trimester: Secondary | ICD-10-CM | POA: Diagnosis not present

## 2021-11-19 HISTORY — PX: CERVICAL CERCLAGE: SHX1329

## 2021-11-19 LAB — TYPE AND SCREEN
ABO/RH(D): O POS
Antibody Screen: NEGATIVE

## 2021-11-19 LAB — CBC
HCT: 39.1 % (ref 36.0–46.0)
Hemoglobin: 12.9 g/dL (ref 12.0–15.0)
MCH: 25 pg — ABNORMAL LOW (ref 26.0–34.0)
MCHC: 33 g/dL (ref 30.0–36.0)
MCV: 75.6 fL — ABNORMAL LOW (ref 80.0–100.0)
Platelets: 208 10*3/uL (ref 150–400)
RBC: 5.17 MIL/uL — ABNORMAL HIGH (ref 3.87–5.11)
RDW: 16.3 % — ABNORMAL HIGH (ref 11.5–15.5)
WBC: 8.2 10*3/uL (ref 4.0–10.5)
nRBC: 0 % (ref 0.0–0.2)

## 2021-11-19 LAB — COMPREHENSIVE METABOLIC PANEL
ALT: 24 U/L (ref 0–44)
AST: 25 U/L (ref 15–41)
Albumin: 2.7 g/dL — ABNORMAL LOW (ref 3.5–5.0)
Alkaline Phosphatase: 49 U/L (ref 38–126)
Anion gap: 5 (ref 5–15)
BUN: 8 mg/dL (ref 6–20)
CO2: 23 mmol/L (ref 22–32)
Calcium: 9 mg/dL (ref 8.9–10.3)
Chloride: 107 mmol/L (ref 98–111)
Creatinine, Ser: 0.79 mg/dL (ref 0.44–1.00)
GFR, Estimated: >60 mL/min (ref >60)
Glucose, Bld: 96 mg/dL (ref 70–99)
Potassium: 4.2 mmol/L (ref 3.5–5.1)
Sodium: 135 mmol/L (ref 135–145)
Total Bilirubin: 0.3 mg/dL (ref 0.3–1.2)
Total Protein: 5.9 g/dL — ABNORMAL LOW (ref 6.5–8.1)

## 2021-11-19 LAB — URIC ACID: Uric Acid, Serum: 5.7 mg/dL (ref 2.5–7.1)

## 2021-11-19 LAB — RESP PANEL BY RT-PCR (FLU A&B, COVID) ARPGX2
Influenza A by PCR: NEGATIVE
Influenza B by PCR: NEGATIVE
SARS Coronavirus 2 by RT PCR: NEGATIVE

## 2021-11-19 LAB — PROTEIN / CREATININE RATIO, URINE
Creatinine, Urine: <10 mg/dL
Total Protein, Urine: <6 mg/dL

## 2021-11-19 SURGERY — CERCLAGE, CERVIX, VAGINAL APPROACH
Anesthesia: Spinal | Laterality: Bilateral

## 2021-11-19 MED ORDER — CHLOROPROCAINE HCL 50 MG/5ML IT SOLN
INTRATHECAL | Status: DC | PRN
  Administered 2021-11-19: 3.5 mL via INTRATHECAL

## 2021-11-19 MED ORDER — DIPHENHYDRAMINE HCL 50 MG/ML IJ SOLN
12.5000 mg | Freq: Once | INTRAMUSCULAR | Status: AC
  Administered 2021-11-19: 18:00:00 12.5 mg via INTRAVENOUS

## 2021-11-19 MED ORDER — STERILE WATER FOR IRRIGATION IR SOLN
Status: DC | PRN
  Administered 2021-11-19: 1000 mL

## 2021-11-19 MED ORDER — PHENYLEPHRINE HCL (PRESSORS) 10 MG/ML IV SOLN
INTRAVENOUS | Status: DC | PRN
  Administered 2021-11-19 (×4): 40 ug via INTRAVENOUS

## 2021-11-19 MED ORDER — FENTANYL CITRATE (PF) 100 MCG/2ML IJ SOLN
INTRAMUSCULAR | Status: DC | PRN
  Administered 2021-11-19: 25 ug via INTRATHECAL

## 2021-11-19 MED ORDER — PHENYLEPHRINE 40 MCG/ML (10ML) SYRINGE FOR IV PUSH (FOR BLOOD PRESSURE SUPPORT)
PREFILLED_SYRINGE | INTRAVENOUS | Status: AC
  Filled 2021-11-19: qty 10

## 2021-11-19 MED ORDER — CEFAZOLIN SODIUM-DEXTROSE 2-4 GM/100ML-% IV SOLN
INTRAVENOUS | Status: AC
  Filled 2021-11-19: qty 100

## 2021-11-19 MED ORDER — SODIUM CHLORIDE (PF) 0.9 % IJ SOLN
Freq: Once | INTRAMUSCULAR | Status: DC
  Filled 2021-11-19 (×2): qty 1

## 2021-11-19 MED ORDER — POVIDONE-IODINE 10 % EX SWAB: 2.0000 "application " | Freq: Once | CUTANEOUS | Status: DC

## 2021-11-19 MED ORDER — FENTANYL CITRATE (PF) 100 MCG/2ML IJ SOLN
INTRAMUSCULAR | Status: AC
  Filled 2021-11-19: qty 2

## 2021-11-19 MED ORDER — ONDANSETRON HCL 4 MG/2ML IJ SOLN
INTRAMUSCULAR | Status: AC
  Filled 2021-11-19: qty 2

## 2021-11-19 MED ORDER — DIPHENHYDRAMINE HCL 50 MG/ML IJ SOLN
INTRAMUSCULAR | Status: AC
  Filled 2021-11-19: qty 1

## 2021-11-19 MED ORDER — LACTATED RINGERS IV SOLN
INTRAVENOUS | Status: DC
Start: 2021-11-19 — End: 2021-11-20

## 2021-11-19 MED ORDER — CEFAZOLIN SODIUM-DEXTROSE 2-4 GM/100ML-% IV SOLN
2.0000 g | INTRAVENOUS | Status: AC
  Administered 2021-11-19: 16:00:00 2 g via INTRAVENOUS

## 2021-11-19 MED ORDER — SODIUM CHLORIDE (PF) 0.9 % IJ SOLN
INTRAMUSCULAR | Status: DC | PRN
  Administered 2021-11-19: 17:00:00 30 mL via VAGINAL

## 2021-11-19 MED ORDER — ONDANSETRON HCL 4 MG/2ML IJ SOLN
INTRAMUSCULAR | Status: DC | PRN
  Administered 2021-11-19: 4 mg via INTRAVENOUS

## 2021-11-19 SURGICAL SUPPLY — 19 items
CANISTER SUCT 3000ML PPV (MISCELLANEOUS) ×3 IMPLANT
GLOVE BIO SURGEON STRL SZ7.5 (GLOVE) ×3 IMPLANT
GLOVE BIOGEL PI IND STRL 7.0 (GLOVE) ×1 IMPLANT
GLOVE BIOGEL PI IND STRL 7.5 (GLOVE) ×1 IMPLANT
GLOVE BIOGEL PI INDICATOR 7.0 (GLOVE) ×2
GLOVE BIOGEL PI INDICATOR 7.5 (GLOVE) ×2
GOWN STRL REUS W/TWL LRG LVL3 (GOWN DISPOSABLE) ×6 IMPLANT
NS IRRIG 1000ML POUR BTL (IV SOLUTION) ×3 IMPLANT
PACK VAGINAL MINOR WOMEN LF (CUSTOM PROCEDURE TRAY) ×3 IMPLANT
PAD OB MATERNITY 4.3X12.25 (PERSONAL CARE ITEMS) ×3 IMPLANT
PAD PREP 24X48 CUFFED NSTRL (MISCELLANEOUS) ×3 IMPLANT
SUT MERSILENE 5MM BP 1 12 (SUTURE) ×3 IMPLANT
SUT PROLENE 1 CT 1 30 (SUTURE) ×3 IMPLANT
SYR BULB IRRIGATION 50ML (SYRINGE) ×3 IMPLANT
TOWEL OR 17X24 6PK STRL BLUE (TOWEL DISPOSABLE) ×3 IMPLANT
TRAY FOLEY W/BAG SLVR 14FR (SET/KITS/TRAYS/PACK) ×3 IMPLANT
TUBING NON-CON 1/4 X 20 CONN (TUBING) IMPLANT
TUBING NON-CON 1/4 X 20' CONN (TUBING)
YANKAUER SUCT BULB TIP NO VENT (SUCTIONS) IMPLANT

## 2021-11-19 NOTE — H&P (Addendum)
Dawn Vaughn is a 41 y.o. female, Estimated Date of Delivery: 05/18/22  Establised: No LMP recorded. Patient is pregnant. G2P0100, IUP at 14.2 weeks, presenting for cerclage placement with DR Su Hilt r/t h/o preterm delivery at 20 weeks last pregnancy. Denies vaginal leakage. Denies vaginal bleeding. Denies feeling cxt's.   Prenatal History with [facility]:  advanced maternal age gravida (40 yo) Bacteriuria (K pneumonia at [facility], treated, check TOC after treatment) genital herpes simplex (Valtrex from 36 weeks) maternal obesity complicating pregnancy, childbirth and the puerperium, antepartum (BMI 41, follow growth, antenatal testing from 34 weeks, delivery by 40 weeks.) miscarriage in second trimester (preterm delivery at 20 weeks, +beta lupus lab, others unremarkable)  vitamin D deficiency (Noted at [facility], recommended supplementation, recheck PP.)  Blood Type  O+  Rhogam  N/A  Antibody  Neg  GTT: Early: 6.0 with 1HGTT 104  Rubella:  Immune  RPR:   NR  HBsAg/HCV:  Neg/Neg  HIV:   Neg  GBS:  Not in urine (For PCN allergy, check sensitivities)   Chlamydia: ???  GC: ???  PAP: ???  Hgb Electrophoresis:  AS  Hgb NOB: 12.2        There are no problems to display for this patient.    Active Ambulatory Problems    Diagnosis Date Noted   No Active Ambulatory Problems   Resolved Ambulatory Problems    Diagnosis Date Noted   No Resolved Ambulatory Problems   Past Medical History:  Diagnosis Date   Anemia    Sickle cell trait (HCC)       No medications prior to admission.    Past Medical History:  Diagnosis Date   Anemia    Sickle cell trait (HCC)      No current facility-administered medications on file prior to encounter.   Current Outpatient Medications on File Prior to Encounter  Medication Sig Dispense Refill   Ferrous Sulfate (IRON PO) Take 2 tablets by mouth in the morning.     Prenatal Vit-Fe Fumarate-FA (PRENATAL VITAMIN PO) Take 1 tablet by mouth daily with lunch.      clindamycin (CLEOCIN) 300 MG capsule Take 300 mg by mouth 2 (two) times daily.       No Known Allergies  OB History     Gravida  2   Para  1   Term      Preterm  1   AB      Living  0      SAB      IAB      Ectopic      Multiple      Live Births  0        Obstetric Comments  With first did not know she was preg, was still having "periods" every month; D&C for retained placenta        Past Medical History:  Diagnosis Date   Anemia    Sickle cell trait (HCC)    Past Surgical History:  Procedure Laterality Date   DILATION AND CURETTAGE OF UTERUS     Family History: family history includes Diabetes in her mother; Healthy in her father; Heart disease in her mother; Hypertension in her mother. Social History:  reports that she has never smoked. She has never used smokeless tobacco. She reports that she does not currently use alcohol. She reports that she does not use drugs.  ROS:  Review of Systems  Constitutional: Negative.   HENT: Negative.    Eyes: Negative.  Respiratory: Negative.    Cardiovascular: Negative.   Gastrointestinal: Negative.   Genitourinary: Negative.   Musculoskeletal: Negative.   Skin: Negative.   Neurological: Negative.   Endo/Heme/Allergies: Negative.   Psychiatric/Behavioral: Negative.      Physical Exam: There were no vitals taken for this visit. To be taken upon admission  Physical Exam Vitals and nursing note reviewed. Exam conducted with a chaperone present.  Constitutional:      Appearance: Normal appearance.  HENT:     Head: Normocephalic and atraumatic.     Nose: Nose normal.     Mouth/Throat:     Mouth: Mucous membranes are moist.  Eyes:     Pupils: Pupils are equal, round, and reactive to light.  Cardiovascular:     Rate and Rhythm: Normal rate and regular rhythm.  Pulmonary:     Effort: Pulmonary effort is normal.     Breath sounds: Normal breath sounds.  Abdominal:     General: Bowel sounds are  normal.  Genitourinary:    Comments: Deferred to DR Su Hilt  Musculoskeletal:     Cervical back: Normal range of motion and neck supple.  Skin:    General: Skin is warm.     Capillary Refill: Capillary refill takes less than 2 seconds.  Neurological:     General: No focal deficit present.     Mental Status: She is alert.  Psychiatric:        Mood and Affect: Mood normal.     FHT: RN staff to check upon admission  UC:   none SVE:   Deferred, to be checked by DR Su Hilt upon admission    Labs: No results found for this or any previous visit (from the past 24 hour(s)).  Imaging:  No results found.  MAU Course: No orders of the defined types were placed in this encounter.  Meds ordered this encounter  Medications   ceFAZolin (ANCEF) IVPB 2g/100 mL premix    Order Specific Question:   Indication:    Answer:   Surgical Prophylaxis    Assessment/Plan: Dawn Vaughn is a 40 y.o. female, Estimated Date of Delivery: 05/18/22  Establised: No LMP recorded. Patient is pregnant. G2P0100, IUP at 14.2 weeks, presenting for cerclage placement with DR Su Hilt r/t h/o preterm delivery at 25 weeks last pregnancy. Denies vaginal leakage. Denies vaginal bleeding. Denies feeling cxt's.   Prenatal History with [facility]:  advanced maternal age gravida (40 yo) Bacteriuria (K pneumonia at [facility], treated, check TOC after treatment) genital herpes simplex (Valtrex from 36 weeks) maternal obesity complicating pregnancy, childbirth and the puerperium, antepartum (BMI 41, follow growth, antenatal testing from 34 weeks, delivery by 40 weeks.) miscarriage in second trimester (preterm delivery at 20 weeks, +beta lupus lab, others unremarkable)  vitamin D deficiency (Noted at NO[facility], recommended supplementation, recheck PP.)    Plan: Admitting to OR per consult with DR Su Hilt Cerclage placement.  Routine [facility] orders To be discharged home after if stable condition.   [facility] NP-C, CNM,  MSN 11/19/2021, 7:23 AM  Risks benefits alternatives reviewed with the patient including but not limited to bleeding infection injury, ROM and loss of pregnancy.  Questions answered and consent signed and witnessed.

## 2021-11-19 NOTE — Anesthesia Preprocedure Evaluation (Signed)
Anesthesia Evaluation  Patient identified by MRN, date of birth, ID band Patient awake    Reviewed: Allergy & Precautions, H&P , NPO status , Patient's Chart, lab work & pertinent test results, reviewed documented beta blocker date and time   Airway Mallampati: II  TM Distance: >3 FB Neck ROM: full    Dental no notable dental hx.    Pulmonary neg pulmonary ROS,    Pulmonary exam normal breath sounds clear to auscultation       Cardiovascular negative cardio ROS Normal cardiovascular exam Rhythm:regular Rate:Normal     Neuro/Psych negative neurological ROS  negative psych ROS   GI/Hepatic negative GI ROS, Neg liver ROS,   Endo/Other  negative endocrine ROS  Renal/GU negative Renal ROS  negative genitourinary   Musculoskeletal   Abdominal   Peds  Hematology  (+) Blood dyscrasia, anemia ,   Anesthesia Other Findings   Reproductive/Obstetrics (+) Pregnancy                             Anesthesia Physical Anesthesia Plan  ASA: 2  Anesthesia Plan: Spinal   Post-op Pain Management:    Induction:   PONV Risk Score and Plan: 2 and Ondansetron and Treatment may vary due to age or medical condition  Airway Management Planned: Natural Airway  Additional Equipment: None  Intra-op Plan:   Post-operative Plan:   Informed Consent: I have reviewed the patients History and Physical, chart, labs and discussed the procedure including the risks, benefits and alternatives for the proposed anesthesia with the patient or authorized representative who has indicated his/her understanding and acceptance.       Plan Discussed with: Anesthesiologist  Anesthesia Plan Comments: (  )        Anesthesia Quick Evaluation

## 2021-11-19 NOTE — Op Note (Signed)
Preop Diagnosis: 1.14 2/7wks 2.Cervical Cerclage   Postop Diagnosis: 1.14 2/7 2.Cervical Cerclage   Procedure: CERCLAGE CERVICAL   Anesthesia: Regional   Anesthesiologist: Bethena Midget, MD   Attending: Osborn Coho, MD   Assistant: N/a  Findings: Cervix closed long and posterior  Pathology: N/a  Fluids: 800 cc  UOP: 150  EBL: 1cc  Complications: None  Procedure:Then patient was taken to the operating room after the risks, benefits and alternatives discussed with the patient and consent signed and witnessed.  The patient was given a spinal per anesthesia and placed in the dorsal lithotomy position.  The patient was prepped and draped in the usual sterile fashion.  A cervical cerclage stitch was placed using Mersilene and the knot was tied anteriorly on the cervix with a stitch of 1 prolene at the base to help elevate knot if necessary when it comes time for removal.  Clindamycin douche was performed.  Membranes remained intact and post procedure fetal heart rate was 165.  Sponge, lap and needle count was correct and the patient was transferred to the recovery room in good condition.

## 2021-11-19 NOTE — Anesthesia Procedure Notes (Signed)
Spinal  Patient location during procedure: OR Start time: 11/19/2021 4:15 PM End time: 11/19/2021 4:18 PM Reason for block: surgical anesthesia Staffing Anesthesiologist: Bethena Midget, MD Preanesthetic Checklist Completed: patient identified, IV checked, site marked, risks and benefits discussed, surgical consent, monitors and equipment checked, pre-op evaluation and timeout performed Spinal Block Patient position: sitting Prep: DuraPrep Patient monitoring: heart rate, cardiac monitor, continuous pulse ox and blood pressure Approach: midline Location: L3-4 Injection technique: single-shot Needle Needle type: Sprotte  Needle gauge: 24 G Needle length: 9 cm Assessment Sensory level: T4 Events: CSF return

## 2021-11-19 NOTE — Transfer of Care (Signed)
Immediate Anesthesia Transfer of Care Note  Patient: Dawn Vaughn  Procedure(s) Performed: CERCLAGE CERVICAL (Bilateral)  Patient Location: PACU  Anesthesia Type:Spinal  Level of Consciousness: awake and alert   Airway & Oxygen Therapy: Patient Spontanous Breathing  Post-op Assessment: Report given to RN and Post -op Vital signs reviewed and stable  Post vital signs: Reviewed and stable  Last Vitals:  Vitals Value Taken Time  BP 119/94 11/19/21 1726  Temp    Pulse 79 11/19/21 1728  Resp 20 11/19/21 1728  SpO2 100 % 11/19/21 1728  Vitals shown include unvalidated device data.  Last Pain:  Vitals:   11/19/21 1351  TempSrc: Oral         Complications: No notable events documented.

## 2021-11-20 LAB — RPR: RPR Ser Ql: NONREACTIVE

## 2021-11-22 NOTE — Anesthesia Postprocedure Evaluation (Signed)
Anesthesia Post Note  Patient: Dawn Vaughn  Procedure(s) Performed: CERCLAGE CERVICAL (Bilateral)     Patient location during evaluation: PACU Anesthesia Type: Spinal Level of consciousness: oriented and awake and alert Pain management: pain level controlled Vital Signs Assessment: post-procedure vital signs reviewed and stable Respiratory status: spontaneous breathing, respiratory function stable and patient connected to nasal cannula oxygen Cardiovascular status: blood pressure returned to baseline and stable Postop Assessment: no headache, no backache and no apparent nausea or vomiting Anesthetic complications: no   No notable events documented.  Last Vitals:  Vitals:   11/19/21 1859 11/19/21 1915  BP: (!) 141/90 (!) 149/90  Pulse: 79 99  Resp: 13 (!) 24  Temp:    SpO2: 99% 99%    Last Pain:  Vitals:   11/19/21 1845  TempSrc:   PainSc: 0-No pain                 Kenadie Royce

## 2021-11-24 ENCOUNTER — Other Ambulatory Visit: Payer: Self-pay

## 2021-11-24 NOTE — Discharge Summary (Signed)
Physician Discharge Summary  Patient ID: Dawn Vaughn MRN: [id number] DOB/AGE: 1981-07-08 40 y.o.  Admit date: 11/19/2021 Discharge date: 11/24/2021  Admission Diagnoses: 14 1/7wks H/o incompetent cervix  Discharge Diagnoses:  14 1/7wks H/o incompetent cervix S/p cervical cerclage  Discharged Condition: good  Hospital Course: pt presented for outpt procedure and underwent cervical cerclage uneventfully  Consults: None  Significant Diagnostic Studies: n/a  Treatments: surgery: as above  Discharge Exam: Blood pressure (!) 149/90, pulse 99, temperature 98.7 F (37.1 C), temperature source Oral, resp. rate (!) 24, height 5\' 3"  (1.6 m), weight 109.9 kg, SpO2 99 %. General appearance: alert and no distress Resp: clear to auscultation bilaterally Cardio: regular rate and rhythm GI: gravid, NT Pelvic: cervix normal in appearance, external genitalia normal, no cervical motion tenderness, and cervix closed long and cerclage in place Extremities: no calf tenderness FHT 165  Disposition: Discharge disposition: 01-Home or Self Care       Discharge Instructions     Place in observation (patient's expected length of stay will be less than 2 midnights)   Complete by: As directed       Allergies as of 11/19/2021   No Known Allergies      Medication List     TAKE these medications    clindamycin 300 MG capsule Commonly known as: CLEOCIN Take 300 mg by mouth 2 (two) times daily.   IRON PO Take 2 tablets by mouth in the morning.   PRENATAL VITAMIN PO Take 1 tablet by mouth daily with lunch.        Follow-up Information     11/21/2021, MD Follow up in 1 week(s).   Specialty: Obstetrics and Gynecology Why: call to schedule f/u appointment in office in 1-2wks Contact information: [address] [place] [phone]                 Signed: [phone] 11/24/2021, 11:41 AM

## 2021-11-28 NOTE — L&D Delivery Note (Signed)
Delivery Note At 1:18 AM a viable female was delivered via Vaginal, Spontaneous (Presentation: Right Occiput Anterior).  APGAR: 8, 9; weight  pending.   Placenta status: Spontaneous, Intact.  Cord: 3 vessels with the following complications: None.  Cord pH: n/a  Anesthesia: Epidural Episiotomy: None Lacerations: 1st degree;Perineal;Vaginal, bilateral labial abrasions Suture Repair: 2.0 vicryl Est. Blood Loss (mL): 200  Mom to postpartum.  Baby to Couplet care / Skin to Skin.  Dawn Vaughn 04/28/2022, 1:41 AM   Will continue to monitor BPs and if elevated will start MgSO4.  UOP 1400cc/6.5hrs.

## 2021-12-08 ENCOUNTER — Other Ambulatory Visit: Payer: Self-pay

## 2021-12-08 ENCOUNTER — Other Ambulatory Visit: Payer: Self-pay | Admitting: Obstetrics and Gynecology

## 2021-12-08 DIAGNOSIS — Z363 Encounter for antenatal screening for malformations: Secondary | ICD-10-CM

## 2021-12-15 ENCOUNTER — Other Ambulatory Visit: Payer: Self-pay

## 2021-12-15 ENCOUNTER — Ambulatory Visit: Payer: BC Managed Care – PPO | Attending: Obstetrics and Gynecology

## 2021-12-15 ENCOUNTER — Encounter: Payer: Self-pay | Admitting: *Deleted

## 2021-12-15 ENCOUNTER — Ambulatory Visit (HOSPITAL_BASED_OUTPATIENT_CLINIC_OR_DEPARTMENT_OTHER): Payer: BC Managed Care – PPO | Admitting: Maternal & Fetal Medicine

## 2021-12-15 ENCOUNTER — Other Ambulatory Visit: Payer: Self-pay | Admitting: *Deleted

## 2021-12-15 ENCOUNTER — Ambulatory Visit (HOSPITAL_BASED_OUTPATIENT_CLINIC_OR_DEPARTMENT_OTHER): Payer: BC Managed Care – PPO

## 2021-12-15 ENCOUNTER — Ambulatory Visit: Payer: BC Managed Care – PPO | Admitting: *Deleted

## 2021-12-15 ENCOUNTER — Other Ambulatory Visit: Payer: Self-pay | Admitting: Obstetrics and Gynecology

## 2021-12-15 VITALS — BP 134/82 | HR 97

## 2021-12-15 DIAGNOSIS — O3432 Maternal care for cervical incompetence, second trimester: Secondary | ICD-10-CM

## 2021-12-15 DIAGNOSIS — O09522 Supervision of elderly multigravida, second trimester: Secondary | ICD-10-CM

## 2021-12-15 DIAGNOSIS — Z6841 Body Mass Index (BMI) 40.0 and over, adult: Secondary | ICD-10-CM

## 2021-12-15 DIAGNOSIS — Z8759 Personal history of other complications of pregnancy, childbirth and the puerperium: Secondary | ICD-10-CM

## 2021-12-15 DIAGNOSIS — O99019 Anemia complicating pregnancy, unspecified trimester: Secondary | ICD-10-CM

## 2021-12-15 DIAGNOSIS — D573 Sickle-cell trait: Secondary | ICD-10-CM | POA: Diagnosis present

## 2021-12-15 DIAGNOSIS — O99891 Other specified diseases and conditions complicating pregnancy: Secondary | ICD-10-CM

## 2021-12-15 DIAGNOSIS — Z363 Encounter for antenatal screening for malformations: Secondary | ICD-10-CM

## 2021-12-15 DIAGNOSIS — O09292 Supervision of pregnancy with other poor reproductive or obstetric history, second trimester: Secondary | ICD-10-CM | POA: Diagnosis not present

## 2021-12-15 NOTE — Progress Notes (Addendum)
Name: Dawn Vaughn Indication: Maternal Sickle Cell Trait (Hb A/S)  DOB: [birth year] Age: 41 y.o.   EDC: 05/18/2022 LMP: 08/09/2021 Referring Provider:  Osborn Coho, MD  EGA: [redacted]w[redacted]d Genetic Counselor: Teena Dunk, MS, CGC  OB Hx: G2P0100 Date of Appointment: 12/15/2021  Accompanied by: Her friend Face to Face Time: 40 Minutes   Previous Testing Completed: Dawn Vaughn previously completed Non-Invasive Prenatal Screening (NIPS) in this pregnancy (scanned into Epic under the Media tab). The result is low risk. This screening significantly reduces the risk that the current pregnancy has Down syndrome, Trisomy 77, Trisomy 13, Monosomy X, and Triploidy, however, the risk is not zero given the limitations of NIPS. Additionally, there are many genetic conditions that cannot be detected by NIPS.  Dawn Vaughn completed a hemoglobin electrophoresis in this pregnancy. The hemoglobin pattern and concentrations are consistent with sickle cell trait (heterozygous).   Medical History:  This is Dawn Vaughn's 2nd pregnancy. She has had one second trimester loss. Reports she takes prenatal vitamins and vitamin D supplements. Reports some bleeding earlier in the pregnancy which resolved. Denies personal history of diabetes, high blood pressure, thyroid conditions, and seizures. Denies infections and fevers in this pregnancy. Denies using tobacco, alcohol, or street drugs in this pregnancy.   Family History: A pedigree was created and scanned into Epic under the Media tab. Information about the biological father of the pregnancy and his side of the family is not available. Dawn Vaughn reports her maternal half sister has a son with Autism. He is otherwise healthy.  Dawn Vaughn reports her mother had 9 total miscarriages.  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 Denies Ashkenazi Jewish ancestry. Family history not remarkable for consanguinity, individuals with birth defects, or intellectual  disability.     Genetic Counseling:   Carrier of Sickle Cell Disease. Dawn Vaughn is a carrier of sickle cell trait (Hemoglobin S: Hb A/S). Hemoglobin S is a structural hemoglobin variant that replaces one of the ?-chains of hemoglobin. It is caused by mutations in the HBB gene. Carriers of sickle cell trait typically do not have any clinical features, however, may experience sickling under extreme conditions. Individuals who carry sickle cell trait would have a 25% risk to have offspring affected with Sickle Cell Disease if their reproductive partner is also found to be a carrier for a ?-globin chain abnormality (recessive inheritance). Sickle Cell Disease caused by the homozygous HBB variant p.Glu6Val (Hb S/S), is the most common cause of Sickle Cell Disease in the [place]. Other Sickle Cell Disorders caused by compound heterozygous HBB pathogenic variants includes Sickle-Hemoglobin C Disease (Hb S/C) and two types of Sickle ?-Thalassemia (Hb S/?+ and Hb S/?). Other ?-globin chain variants such as D-Punjab, O-Arab, and E also result in Sickle Cell Disorders when inherited with HbS.  Most individuals with Sickle Cell Disease are healthy at birth and become symptomatic later in infancy or childhood after fetal hemoglobin levels decrease. Symptoms include infants with spontaneous painful swelling of the hands/feet, recurrent episodes of severe pain with no other identified etiology, unexplained anemia not related to iron deficiency, pallor, jaundice, pneumococcal sepsis or meningitis, severe anemia with splenic enlargement, childhood stroke, etc. Given that Dawn Vaughn is a carrier of sickle cell trait (Hb A/S) genetic counseling recommended screening the biological father of the pregnancy for ? Hemoglobinopathies. Dawn Vaughn shared with genetic counseling that her baby's biological father is not involved and she declined this screening on his behalf. Given that the biological father is not involved, genetic  counseling offered Dawn Vaughn the option of  amniocentesis for prenatal diagnosis. We reviewed that genetic testing is limited when the biological father's genetics are not available for the laboratory to use in the analysis of the amniotic fluid sample. Dawn Vaughn verbalized understanding of this limitation and still opted to pursue amniocentesis (scheduled for 12/28/21). Genetic counseling reviewed with Dawn Vaughn that Sickle Cell Disease is one of the conditions included in [name] newborn screening program.  Family history of Autism. Dawn Vaughn reports her maternal half sister has a son with Autism. He is otherwise healthy. Autism Spectrum Disorder affects approximately 1-2% of the general population in the [place], [place], and [place]. Autism is a neurological and developmental disorder that affects how people interact with others, communicate, learn, and behave. Autism is known as a "spectrum" disorder because there is wide variation in the type and severity of symptoms people experience. Genetic testing for individuals with a clinical diagnosis of Autism yields an explanation in only about 20% of cases, and the remaining 80% of cases are left with unknown etiology. We reviewed with Dawn Vaughn that we are unable to test directly for Autism in pregnancy. Given the relation of the individual with Autism to the current pregnancy, the risk for the current pregnancy to also have Autism is not likely to be increased above the general population risk.    Testing/Screening Options:   Amniocentesis. This procedure is available for prenatal diagnosis. Possible procedural difficulties and complications that can arise include maternal infection, cramping, bleeding, fluid leakage, and/or pregnancy loss. The risk for pregnancy loss with an amniocentesis is 1/500. Per the [employer] of Obstetricians and Gynecologists (ACOG) Practice Bulletin 162, all pregnant women should be offered prenatal assessment for  aneuploidy by diagnostic testing regardless of maternal age or other risk factors. If indicated, genetic testing that could be ordered on an amniocentesis sample includes a fetal karyotype, fetal microarray, and testing for specific syndromes.  Carrier Screening. Per the ACOG Committee Opinion 691, if an individual is found to be a carrier for a specific condition, the individual's reproductive partner should be offered testing in order to receive informed genetic counseling about potential reproductive outcomes. Genetic counseling offered carrier screening for the biological father of the pregnancy given Nija's carrier status.    Patient Plan:  Proceed with: Amniocentesis for prenatal diagnosis. Genetic testing that will be ordered includes testing for Sickle Cell Disease and a fetal karyotype with a reflex to a fetal microarray if the karyotype is normal. We reviewed that genetic testing for sickle cell disease is limited when the biological father's genetics are not available for the laboratory to use in the analysis of the amniotic fluid sample. Anne-Marie verbalized understanding of this limitation and still opted to pursue amniocentesis (scheduled for 12/28/21). Informed consent was obtained. All questions were answered.  Declined: Carrier Screening for the biological father of the pregnancy   Addendum as of 12/28/2021: During her genetic counseling appointment on 12/15/2021, Foy opted to pursue amniocentesis for prenatal diagnosis. Donnika returned the [facility] on 12/28/2021 for the scheduled amniocentesis. After speaking with Dr. Grace Bushy on 12/28/2021, Etheleen Nicks declined amniocentesis. Please see separate report for details.    Thank you for sharing in the care of Shalina with [place].  Please do not hesitate to contact us if you have any questions.  Teena Dunk, MS, [facility]

## 2021-12-16 ENCOUNTER — Other Ambulatory Visit: Payer: Self-pay | Admitting: *Deleted

## 2021-12-16 DIAGNOSIS — O3432 Maternal care for cervical incompetence, second trimester: Secondary | ICD-10-CM

## 2021-12-16 DIAGNOSIS — D571 Sickle-cell disease without crisis: Secondary | ICD-10-CM

## 2021-12-16 DIAGNOSIS — Z8489 Family history of other specified conditions: Secondary | ICD-10-CM

## 2021-12-16 DIAGNOSIS — O99112 Other diseases of the blood and blood-forming organs and certain disorders involving the immune mechanism complicating pregnancy, second trimester: Secondary | ICD-10-CM

## 2021-12-16 DIAGNOSIS — D6862 Lupus anticoagulant syndrome: Secondary | ICD-10-CM

## 2021-12-16 DIAGNOSIS — O99212 Obesity complicating pregnancy, second trimester: Secondary | ICD-10-CM

## 2021-12-21 NOTE — Progress Notes (Signed)
MFM Consultion  Dawn Vaughn is a G2P1 who is here at [place] 6d  with an EDD of 05/18/22 by a 7 w 2d [place]. She is seen at the request of Dr. Everett Graff regarding the following clinical issues.  1) Prior 25 weeks loss  2) Increased lupus anticoagulant (LAC).  Ms. Areola reports that she did not realized she was pregnant initially. Given her prior history of 25 week she was counseled regarding a history indicated cerclage and IM progesterone. She is here with a cerclage and has received two doses of 17P. She reports she is doing well today and denies s/sx of preterm labor.   Regarding her LAC I believed she initially had a work up evaluating antiphospholipid antibody syndrome given her midtrimester loss. The value of this lab was not reported however her prenatal record suggest that the [place] was elevated.  Ms. Laconte reports that she presented to the hospital with contractions at home and then a subsequent rupture or membranes. However, her prenatal records suggest painless cervical change only experiencing rectal pressure.  Ms. Bossler had a low risk NIPT.  Vitals with BMI 12/15/2021 11/19/2021 11/19/2021  Height - - -  Weight - - -  BMI - - -  Systolic 263 785 885  Diastolic 82 90 90  Pulse 97 99 79   CBC Latest Ref Rng & Units 11/19/2021 08/12/2007  WBC 4.0 - 10.5 K/uL 8.2 12.3(H)  Hemoglobin 12.0 - 15.0 g/dL 12.9 12.9  Hematocrit 36.0 - 46.0 % 39.1 38.2  Platelets 150 - 400 K/uL 208 158   CMP Latest Ref Rng & Units 11/19/2021  Glucose 70 - 99 mg/dL 96  BUN 6 - 20 mg/dL 8  Creatinine 0.44 - 1.00 mg/dL 0.79  Sodium 135 - 145 mmol/L 135  Potassium 3.5 - 5.1 mmol/L 4.2  Chloride 98 - 111 mmol/L 107  CO2 22 - 32 mmol/L 23  Calcium 8.9 - 10.3 mg/dL 9.0  Total Protein 6.5 - 8.1 g/dL 5.9(L)  Total Bilirubin 0.3 - 1.2 mg/dL 0.3  Alkaline Phos 38 - 126 U/L 49  AST 15 - 41 U/L 25  ALT 0 - 44 U/L 24   Past Medical History:  Diagnosis Date   Anemia    Sickle cell trait [facility])     Past Surgical History:  Procedure Laterality Date   CERVICAL CERCLAGE Bilateral 11/19/2021   Procedure: CERCLAGE CERVICAL;  Surgeon: Everett Graff, MD;  Location: [facility] LD ORS;  Service: Gynecology;  Laterality: Bilateral;   DILATION AND CURETTAGE OF UTERUS     Social History   Socioeconomic History   Marital status: Single    Spouse name: Not on file   Number of children: Not on file   Years of education: Not on file   Highest education level: Not on file  Occupational History   Not on file  Tobacco Use   Smoking status: Never   Smokeless tobacco: Never  Vaping Use   Vaping Use: Never used  Substance and Sexual Activity   Alcohol use: Not Currently   Drug use: Never   Sexual activity: Yes  Other Topics Concern   Not on file  Social History Narrative   Not on file   Social Determinants of Health   Financial Resource Strain: Not on file  Food Insecurity: Not on file  Transportation Needs: Not on file  Physical Activity: Not on file  Stress: Not on file  Social Connections: Not on file  Intimate Partner Violence: Not on  file   Family History  Problem Relation Age of Onset   Hypertension Mother    Diabetes Mother    Heart disease Mother    Healthy Father        Current Outpatient Medications (Analgesics):    aspirin EC 81 MG tablet, Take 81 mg by mouth daily. Swallow whole.  Current Outpatient Medications (Hematological):    Ferrous Sulfate (IRON PO), Take 2 tablets by mouth in the morning.  Current Outpatient Medications (Other):    Prenatal Vit-Fe Fumarate-FA (PRENATAL VITAMIN PO), Take 1 tablet by mouth daily with lunch. No Known Allergies  Imaging: Single intrauterine pregnancy here for a detailed anatomy due elevated BMI, history of midtrimester loss and positive lupus anticoagulant.  Normal anatomy with measurements consistent with dates  There is good fetal movement and amniotic fluid volume Suboptimal views of the fetal anatomy were obtained  secondary to fetal position.   Cerclage placed and present. CL is 3.5 cm with > 2 cm from the internal os to the cerclage stitch.    Impression/Counseling:  I discussed with Ms. Nordling that I agree with her management.  Her history of preterm delivery is difficult to elucidate the cause, whether it was infection related, preterm labor vs cervical incompetence. Nevertheless, the treatment is CL screening with IM/Vag progesterone +/- cerclage. She elected a history indicated cerclage as she reports she would not want to experience this loss again.  She will continue on the 17 P until [redacted] week gestation.  Secondly, she has a positive LAC as one component of an evaluation of antiphospholipid antibody syndrome. I discussed the APLAS is defined by a laboratory and clinical diagnosis. Including elevations in B2Glycoprotein, LAC or anticardiolipin antibody > 2 x normal on two occasions at 12 weeks apart. We do not have the numerical value of the elevated LAC. We discussed that she meets the clinical criteria of an midtrimester loss. However, given that we don't have elevated labs I recommend daily low dose ASA rather than anticoagulation +  daily LD aspirin.   She does not a history of thrombosis or arterial related symptoms.  I do recommend that she have her other APLAS labs drawn.  1) At the conclusion of our visit we recommend serial growth in 4 weeks.  2) Continue daily low dose ASA.  3) Complete APLAS workup   4) She met with our genetic counselor regarding her sickle cell trait and options for screening and diagnostic testing please see that report for details.  I spent 45 minutes with > 50% in face to face consultation.  Vikki Ports, MD.

## 2021-12-28 ENCOUNTER — Other Ambulatory Visit: Payer: Self-pay

## 2021-12-28 ENCOUNTER — Ambulatory Visit: Payer: BC Managed Care – PPO

## 2021-12-28 ENCOUNTER — Ambulatory Visit: Payer: Self-pay | Admitting: Genetics

## 2021-12-28 ENCOUNTER — Ambulatory Visit: Payer: BC Managed Care – PPO | Attending: Maternal & Fetal Medicine | Admitting: *Deleted

## 2021-12-28 ENCOUNTER — Encounter: Payer: Self-pay | Admitting: *Deleted

## 2021-12-28 VITALS — BP 131/87 | HR 102

## 2021-12-28 DIAGNOSIS — Z539 Procedure and treatment not carried out, unspecified reason: Secondary | ICD-10-CM | POA: Insufficient documentation

## 2021-12-28 DIAGNOSIS — D6862 Lupus anticoagulant syndrome: Secondary | ICD-10-CM | POA: Diagnosis not present

## 2021-12-28 DIAGNOSIS — M329 Systemic lupus erythematosus, unspecified: Secondary | ICD-10-CM | POA: Insufficient documentation

## 2021-12-28 DIAGNOSIS — O3432 Maternal care for cervical incompetence, second trimester: Secondary | ICD-10-CM | POA: Insufficient documentation

## 2021-12-28 DIAGNOSIS — O99891 Other specified diseases and conditions complicating pregnancy: Secondary | ICD-10-CM | POA: Insufficient documentation

## 2021-12-28 DIAGNOSIS — O09522 Supervision of elderly multigravida, second trimester: Secondary | ICD-10-CM

## 2021-12-28 DIAGNOSIS — O99212 Obesity complicating pregnancy, second trimester: Secondary | ICD-10-CM | POA: Diagnosis not present

## 2021-12-28 DIAGNOSIS — O99112 Other diseases of the blood and blood-forming organs and certain disorders involving the immune mechanism complicating pregnancy, second trimester: Secondary | ICD-10-CM | POA: Insufficient documentation

## 2021-12-28 DIAGNOSIS — Z3A18 18 weeks gestation of pregnancy: Secondary | ICD-10-CM | POA: Diagnosis not present

## 2021-12-28 DIAGNOSIS — Z8759 Personal history of other complications of pregnancy, childbirth and the puerperium: Secondary | ICD-10-CM | POA: Insufficient documentation

## 2021-12-28 DIAGNOSIS — O99012 Anemia complicating pregnancy, second trimester: Secondary | ICD-10-CM | POA: Diagnosis not present

## 2021-12-28 DIAGNOSIS — D573 Sickle-cell trait: Secondary | ICD-10-CM | POA: Insufficient documentation

## 2022-01-06 ENCOUNTER — Other Ambulatory Visit: Payer: Self-pay

## 2022-01-06 ENCOUNTER — Inpatient Hospital Stay (HOSPITAL_COMMUNITY)
Admission: AD | Admit: 2022-01-06 | Discharge: 2022-01-06 | Disposition: A | Discharge status: Home / Self Care | Payer: BC Managed Care – PPO | Attending: Obstetrics & Gynecology | Admitting: Obstetrics & Gynecology

## 2022-01-06 ENCOUNTER — Encounter (HOSPITAL_COMMUNITY): Payer: Self-pay | Admitting: Obstetrics & Gynecology

## 2022-01-06 DIAGNOSIS — A5901 Trichomonal vulvovaginitis: Secondary | ICD-10-CM | POA: Diagnosis not present

## 2022-01-06 DIAGNOSIS — R300 Dysuria: Secondary | ICD-10-CM | POA: Diagnosis not present

## 2022-01-06 DIAGNOSIS — O99891 Other specified diseases and conditions complicating pregnancy: Secondary | ICD-10-CM | POA: Insufficient documentation

## 2022-01-06 DIAGNOSIS — A599 Trichomoniasis, unspecified: Secondary | ICD-10-CM | POA: Diagnosis not present

## 2022-01-06 DIAGNOSIS — Z3A21 21 weeks gestation of pregnancy: Secondary | ICD-10-CM | POA: Insufficient documentation

## 2022-01-06 DIAGNOSIS — O4692 Antepartum hemorrhage, unspecified, second trimester: Secondary | ICD-10-CM | POA: Insufficient documentation

## 2022-01-06 DIAGNOSIS — O98312 Other infections with a predominantly sexual mode of transmission complicating pregnancy, second trimester: Secondary | ICD-10-CM | POA: Diagnosis not present

## 2022-01-06 DIAGNOSIS — Z7982 Long term (current) use of aspirin: Secondary | ICD-10-CM | POA: Diagnosis not present

## 2022-01-06 DIAGNOSIS — R102 Pelvic and perineal pain: Secondary | ICD-10-CM | POA: Diagnosis not present

## 2022-01-06 LAB — URINALYSIS, ROUTINE W REFLEX MICROSCOPIC
Bilirubin Urine: NEGATIVE
Glucose, UA: NEGATIVE mg/dL
Hgb urine dipstick: NEGATIVE
Ketones, ur: NEGATIVE mg/dL
Nitrite: NEGATIVE
Protein, ur: NEGATIVE mg/dL
Specific Gravity, Urine: 1.016 (ref 1.005–1.030)
WBC, UA: >50 WBC/hpf — ABNORMAL HIGH (ref 0–5)
pH: 6 (ref 5.0–8.0)

## 2022-01-06 LAB — WET PREP, GENITAL
Clue Cells Wet Prep HPF POC: NONE SEEN
Sperm: NONE SEEN
WBC, Wet Prep HPF POC: <10 (ref <10)
Yeast Wet Prep HPF POC: NONE SEEN

## 2022-01-06 MED ORDER — METRONIDAZOLE 500 MG PO TABS
2000.0000 mg | ORAL_TABLET | Freq: Once | ORAL | Status: AC
  Administered 2022-01-06: 2000 mg via ORAL
  Filled 2022-01-06: qty 4

## 2022-01-06 NOTE — MAU Note (Signed)
...  Dawn Vaughn is a 41 y.o. at [redacted]w[redacted]d here in MAU reporting: Vaginal spotting that began this morning. She passed one tiny blood clot. Her bleeding was scant prior to coming to MAU. Denies any pain other than external vaginal pain. She states her vagina is tender to the touch and it is uncomfortable when she wipes. Was Diagnosed with a yeast infection on 11/27/2021 but just started using her suppository cream three nights ago.  Cerclage place 11/19/2021. She states she was told she was 2cm even with the cerclage in place on 11/27/2021.  Has not had her [name] injection this week. Having complications with insurance.  Pain score:  5/10 vagina  FHT: 154 doppler Lab orders placed from triage:  UA

## 2022-01-06 NOTE — Discharge Instructions (Signed)

## 2022-01-06 NOTE — MAU Provider Note (Addendum)
History     [id number]  Arrival date and time: 01/06/22 1256    Chief Complaint  Patient presents with   Vaginal Bleeding   Vaginal Pain     HPI Dawn Vaughn is a 41 y.o. at [redacted]w[redacted]d by patient report with PMHx notable for 25 wk delivery with fetal demise in last pregnancy and placement of cerclage in this pregnancy, who presents for vaginal bleeding.   Review of outside prenatal records from [employer] [facility] (in media tab): cerclage placed at 14 weeks, otherwise unremarkable labs and progress to date  Patient reports that earlier today had blood only when she wiped after going to the bathroom No large clots or large amount of bleeding Has had some white clumpy vaginal discharge Also endorses burning and pain with urination No large gush of fluid from vagina No contractions Fetal movement normal   --/--/O POS (12/23 1339)  OB History     Gravida  2   Para  1   Term      Preterm  1   AB      Living  0      SAB      IAB      Ectopic      Multiple      Live Births  0        Obstetric Comments  With first did not know she was preg, was still having "periods" every month; D&C for retained placenta         Past Medical History:  Diagnosis Date   Anemia    Sickle cell trait ([place])     Past Surgical History:  Procedure Laterality Date   CERVICAL CERCLAGE Bilateral 11/19/2021   Procedure: CERCLAGE CERVICAL;  Surgeon: Everett Graff, MD;  Location: [facility] LD ORS;  Service: Gynecology;  Laterality: Bilateral;   DILATION AND CURETTAGE OF UTERUS      Family History  Problem Relation Age of Onset   Hypertension Mother    Diabetes Mother    Heart disease Mother    Healthy Father     Social History   Socioeconomic History   Marital status: Single    Spouse name: Not on file   Number of children: Not on file   Years of education: Not on file   Highest education level: Not on file  Occupational History   Not on file  Tobacco Use   Smoking  status: Never   Smokeless tobacco: Never  Vaping Use   Vaping Use: Never used  Substance and Sexual Activity   Alcohol use: Not Currently   Drug use: Never   Sexual activity: Not Currently  Other Topics Concern   Not on file  Social History Narrative   Not on file   Social Determinants of Health   Financial Resource Strain: Not on file  Food Insecurity: Not on file  Transportation Needs: Not on file  Physical Activity: Not on file  Stress: Not on file  Social Connections: Not on file  Intimate Partner Violence: Not on file    No Known Allergies  No current facility-administered medications on file prior to encounter.   Current Outpatient Medications on File Prior to Encounter  Medication Sig Dispense Refill   aspirin EC 81 MG tablet Take 81 mg by mouth daily. Swallow whole.     Ferrous Sulfate (IRON PO) Take 2 tablets by mouth in the morning.     Prenatal Vit-Fe Fumarate-FA (PRENATAL VITAMIN PO) Take 1 tablet by mouth daily  with lunch.       ROS Pertinent positives and negative per HPI, all others reviewed and negative  Physical Exam   Pulse 100    Temp 98.7 F (37.1 C) (Oral)    Resp 17    Ht 5\' 3"  (1.6 m)    Wt 113.6 kg    LMP 08/01/2021    SpO2 100%    BMI 44.36 kg/m   Patient Vitals for the past 24 hrs:  Temp Temp src Pulse Resp SpO2 Height Weight  01/06/22 1309 98.7 F (37.1 C) Oral 100 17 100 % 5\' 3"  (1.6 m) 113.6 kg    Physical Exam Vitals reviewed. Exam conducted with a chaperone present.  Constitutional:      General: She is not in acute distress.    Appearance: She is well-developed. She is not diaphoretic.  Eyes:     General: No scleral icterus. Pulmonary:     Effort: Pulmonary effort is normal. No respiratory distress.  Abdominal:     General: There is no distension.     Palpations: Abdomen is soft.     Tenderness: There is no abdominal tenderness. There is no guarding or rebound.  Genitourinary:    Labia:        Right: No lesion.         Left: No lesion.      Vagina: Vaginal discharge present. No bleeding.     Cervix: No lesion or cervical bleeding.     Comments: Very mild erythema of labia minora and introitus. Moderate amount of clumpy white discharge. No blood seen. Cervix visually closed. Cerclage suture visualized, unremarkable.  Skin:    General: Skin is warm and dry.  Neurological:     Mental Status: She is alert.     Coordination: Coordination normal.     Cervical Exam    Bedside Ultrasound Not done  My interpretation: n/a  FHT 154 bpm by doppler  Labs Results for orders placed or performed during the hospital encounter of 01/06/22 (from the past 24 hour(s))  Urinalysis, Routine w reflex microscopic Urine, Clean Catch     Status: Abnormal   Collection Time: 01/06/22  1:39 PM  Result Value Ref Range   Color, Urine YELLOW YELLOW   APPearance CLOUDY (A) CLEAR   Specific Gravity, Urine 1.016 1.005 - 1.030   pH 6.0 5.0 - 8.0   Glucose, UA NEGATIVE NEGATIVE mg/dL   Hgb urine dipstick NEGATIVE NEGATIVE   Bilirubin Urine NEGATIVE NEGATIVE   Ketones, ur NEGATIVE NEGATIVE mg/dL   Protein, ur NEGATIVE NEGATIVE mg/dL   Nitrite NEGATIVE NEGATIVE   Leukocytes,Ua LARGE (A) NEGATIVE   RBC / HPF 21-50 0 - 5 RBC/hpf   WBC, UA >50 (H) 0 - 5 WBC/hpf   Bacteria, UA MANY (A) NONE SEEN   Squamous Epithelial / LPF 11-20 0 - 5   Mucus PRESENT    Non Squamous Epithelial 0-5 (A) NONE SEEN  Wet prep, genital     Status: Abnormal   Collection Time: 01/06/22  1:57 PM   Specimen: PATH Cytology Cervicovaginal Ancillary Only  Result Value Ref Range   Yeast Wet Prep HPF POC NONE SEEN NONE SEEN   Trich, Wet Prep PRESENT (A) NONE SEEN   Clue Cells Wet Prep HPF POC NONE SEEN NONE SEEN   WBC, Wet Prep HPF POC <10 <10   Sperm NONE SEEN     Imaging No results found.  MAU Course  Procedures Lab Orders  Culture, OB Urine         Wet prep, genital         Urinalysis, Routine w reflex microscopic Urine, Clean Catch     Meds ordered this encounter  Medications   metroNIDAZOLE (FLAGYL) tablet 2,000 mg   Imaging Orders  No imaging studies ordered today    MDM moderate  Assessment and Plan  #Vaginal bleeding in pregnancy, second trimester #Dysuria #Trichomonas #[redacted] weeks gestation of pregnancy No blood seen on speculum exam, cervix visually closed. Given lack of any other symptoms cervical exam deferred as I did not want to provoke more bleeding. Exam c/w yeast infection, however wet prep was negative for yeast and positive for trichomonas. Dysuria likely related to this infection, but OBCx sent regardless. Given 2g Flagyl in MAU. Discussed Trichomonas is an STI, partner needs to be made aware and treated. No intercourse x1 week from treatment. Retest in 4 weeks for TOC.   #FWB Normal FHT by doppler at 154 bpm   Dispo: discharged to home in stable condition.    Addendum Just prior to discharge patient BP of 134/93.  Asymptomatic No prior hx of elevated BP's. Advised to see [facility] for BP visit early next week.   2:54 PM Clarnce Flock, MD/MPH Attending Family Medicine Physician, [facility] [place], MD/MPH 01/06/22 2:41 PM  Allergies as of 01/06/2022   No Known Allergies      Medication List     TAKE these medications    aspirin EC 81 MG tablet Take 81 mg by mouth daily. Swallow whole.   IRON PO Take 2 tablets by mouth in the morning.   PRENATAL VITAMIN PO Take 1 tablet by mouth daily with lunch.

## 2022-01-07 LAB — CULTURE, OB URINE: Culture: 80000 — AB

## 2022-01-07 LAB — GC/CHLAMYDIA PROBE AMP (~~LOC~~) NOT AT ARMC
Chlamydia: NEGATIVE
Comment: NEGATIVE
Comment: NORMAL
Neisseria Gonorrhea: NEGATIVE

## 2022-01-10 ENCOUNTER — Other Ambulatory Visit: Payer: Self-pay | Admitting: Family Medicine

## 2022-01-10 MED ORDER — AMOXICILLIN-POT CLAVULANATE 875-125 MG PO TABS: 1.0000 | ORAL_TABLET | Freq: Two times a day (BID) | ORAL | 0 refills | Status: AC

## 2022-01-12 ENCOUNTER — Ambulatory Visit: Payer: BC Managed Care – PPO

## 2022-01-19 ENCOUNTER — Encounter: Payer: Self-pay | Admitting: *Deleted

## 2022-01-19 ENCOUNTER — Ambulatory Visit: Payer: BC Managed Care – PPO | Admitting: *Deleted

## 2022-01-19 ENCOUNTER — Ambulatory Visit: Payer: BC Managed Care – PPO | Attending: Maternal & Fetal Medicine

## 2022-01-19 ENCOUNTER — Other Ambulatory Visit: Payer: Self-pay

## 2022-01-19 VITALS — BP 151/82 | HR 107

## 2022-01-19 DIAGNOSIS — Z6841 Body Mass Index (BMI) 40.0 and over, adult: Secondary | ICD-10-CM | POA: Insufficient documentation

## 2022-01-19 DIAGNOSIS — O09522 Supervision of elderly multigravida, second trimester: Secondary | ICD-10-CM | POA: Diagnosis not present

## 2022-01-19 DIAGNOSIS — Z3A22 22 weeks gestation of pregnancy: Secondary | ICD-10-CM | POA: Diagnosis not present

## 2022-01-19 DIAGNOSIS — M329 Systemic lupus erythematosus, unspecified: Secondary | ICD-10-CM | POA: Diagnosis present

## 2022-01-19 DIAGNOSIS — Z8759 Personal history of other complications of pregnancy, childbirth and the puerperium: Secondary | ICD-10-CM | POA: Diagnosis present

## 2022-01-19 DIAGNOSIS — O99891 Other specified diseases and conditions complicating pregnancy: Secondary | ICD-10-CM | POA: Insufficient documentation

## 2022-01-19 DIAGNOSIS — O09293 Supervision of pregnancy with other poor reproductive or obstetric history, third trimester: Secondary | ICD-10-CM | POA: Diagnosis not present

## 2022-01-19 DIAGNOSIS — O99019 Anemia complicating pregnancy, unspecified trimester: Secondary | ICD-10-CM | POA: Insufficient documentation

## 2022-01-19 DIAGNOSIS — D573 Sickle-cell trait: Secondary | ICD-10-CM | POA: Insufficient documentation

## 2022-01-19 DIAGNOSIS — O3432 Maternal care for cervical incompetence, second trimester: Secondary | ICD-10-CM | POA: Diagnosis not present

## 2022-01-20 ENCOUNTER — Other Ambulatory Visit: Payer: Self-pay | Admitting: *Deleted

## 2022-01-20 DIAGNOSIS — O3432 Maternal care for cervical incompetence, second trimester: Secondary | ICD-10-CM

## 2022-01-20 DIAGNOSIS — O09899 Supervision of other high risk pregnancies, unspecified trimester: Secondary | ICD-10-CM

## 2022-01-20 DIAGNOSIS — R638 Other symptoms and signs concerning food and fluid intake: Secondary | ICD-10-CM

## 2022-02-28 ENCOUNTER — Encounter: Payer: Self-pay | Admitting: Skilled Nursing Facility1

## 2022-02-28 ENCOUNTER — Encounter: Payer: BC Managed Care – PPO | Attending: Obstetrics and Gynecology | Admitting: Skilled Nursing Facility1

## 2022-02-28 DIAGNOSIS — O24419 Gestational diabetes mellitus in pregnancy, unspecified control: Secondary | ICD-10-CM | POA: Diagnosis present

## 2022-02-28 DIAGNOSIS — Z3A00 Weeks of gestation of pregnancy not specified: Secondary | ICD-10-CM | POA: Insufficient documentation

## 2022-02-28 NOTE — Progress Notes (Signed)
Patient was seen for Gestational Diabetes self-management on 02/28/2022  ?Start time 3:24 and End time 4:15  ? ?Estimated due date: June 21st; 29 weeks ? ?Clinical: ?Medications: prenatal multi ?Medical History: previous anemia, sickle cell trait  ?Labs: ? ?Dietary and Lifestyle History: ?Pt arrived with seemingly supportive husband ?Pt states her sleep has not been the greatest stating her mood has been pretty stable. ?Pt states she she is on modified bed rest. Pt states her doctor advised her to drink 64 ounces of water per day. Pt states she gets about 50 ounces.  ? ?Physical Activity: 5 days a week: 10-15 minutes  ?Stress: states it is pretty normal  ?Sleep: not that great ? ?24 hr Recall: eating fruit about 3 times a day: berries, pineapples, grapes, mango ?First Meal: grits + fruit + sugar free juice ?Snack: lance cracker pack + sugar free juice ?Second meal: popcorn ?Snack: fruit ?Third meal: chicken and dumplings +  greens (with butter) and stuffing  ?Snack: fruit ?Beverages: water, sugar free juice ? ?NUTRITION INTERVENTION  ?Nutrition education (E-1) on the following topics:  ? ?Initial Follow-up ? ?[x]  []  Definition of Gestational Diabetes ?[x]  []  Why dietary management is important in controlling blood glucose ?[x]  []  Effects each nutrient has on blood glucose levels ?[x]  []  Simple carbohydrates vs complex carbohydrates ?[x]  []  Fluid intake ?[x]  []  Creating a balanced meal plan ?[x]  []  Carbohydrate counting  ?[x]  []  When to check blood glucose levels ?[x]  []  Proper blood glucose monitoring techniques ?[x]  []  Effect of stress and stress reduction techniques  ?[x]  []  Exercise effect on blood glucose levels, appropriate exercise during pregnancy ?[x]  []  Importance of limiting caffeine and abstaining from alcohol and smoking ?[x]  []  Medications used for blood sugar control during pregnancy ?[x]  []  Hypoglycemia and rule of 15 ?[x]  []  Postpartum self care ? ?Blood glucose monitor given: Accu Chek Guide Me ?Lot  # ?Exp: 03/16/2023 ?CBG: 108 mg/dL ? ?Patient instructed to monitor glucose levels: ?FBS: 60 - ? 95 mg/dL (some clinics use 90 for cutoff) ?1 hour: ? 140 mg/dL ?2 hour: ? mg/dL ? ?Patient received handouts: ?Nutrition Diabetes and Pregnancy ?Carbohydrate Counting List ? ?Patient will be seen for follow-up: Pt and her husband state they will do well with the food and do not desire any follow-up: Dietitian advised patient to call or e-mail with any questions or concerns. ?

## 2022-03-02 ENCOUNTER — Ambulatory Visit: Payer: BC Managed Care – PPO | Admitting: *Deleted

## 2022-03-02 ENCOUNTER — Ambulatory Visit: Payer: BC Managed Care – PPO | Attending: Obstetrics and Gynecology

## 2022-03-02 ENCOUNTER — Encounter: Payer: Self-pay | Admitting: *Deleted

## 2022-03-02 ENCOUNTER — Other Ambulatory Visit: Payer: Self-pay | Admitting: *Deleted

## 2022-03-02 VITALS — BP 161/88 | HR 84

## 2022-03-02 DIAGNOSIS — R638 Other symptoms and signs concerning food and fluid intake: Secondary | ICD-10-CM | POA: Diagnosis present

## 2022-03-02 DIAGNOSIS — O285 Abnormal chromosomal and genetic finding on antenatal screening of mother: Secondary | ICD-10-CM

## 2022-03-02 DIAGNOSIS — O3432 Maternal care for cervical incompetence, second trimester: Secondary | ICD-10-CM | POA: Diagnosis present

## 2022-03-02 DIAGNOSIS — Z3A28 28 weeks gestation of pregnancy: Secondary | ICD-10-CM

## 2022-03-02 DIAGNOSIS — O09523 Supervision of elderly multigravida, third trimester: Secondary | ICD-10-CM

## 2022-03-02 DIAGNOSIS — O09293 Supervision of pregnancy with other poor reproductive or obstetric history, third trimester: Secondary | ICD-10-CM | POA: Diagnosis not present

## 2022-03-02 DIAGNOSIS — O3433 Maternal care for cervical incompetence, third trimester: Secondary | ICD-10-CM | POA: Diagnosis not present

## 2022-03-02 DIAGNOSIS — O09899 Supervision of other high risk pregnancies, unspecified trimester: Secondary | ICD-10-CM | POA: Insufficient documentation

## 2022-03-02 DIAGNOSIS — O99213 Obesity complicating pregnancy, third trimester: Secondary | ICD-10-CM

## 2022-03-02 DIAGNOSIS — D573 Sickle-cell trait: Secondary | ICD-10-CM

## 2022-03-02 DIAGNOSIS — E669 Obesity, unspecified: Secondary | ICD-10-CM

## 2022-03-09 ENCOUNTER — Other Ambulatory Visit: Payer: Self-pay | Admitting: *Deleted

## 2022-03-09 DIAGNOSIS — O09513 Supervision of elderly primigravida, third trimester: Secondary | ICD-10-CM

## 2022-03-09 DIAGNOSIS — O3433 Maternal care for cervical incompetence, third trimester: Secondary | ICD-10-CM

## 2022-03-09 DIAGNOSIS — O99019 Anemia complicating pregnancy, unspecified trimester: Secondary | ICD-10-CM

## 2022-03-23 ENCOUNTER — Other Ambulatory Visit: Payer: Self-pay

## 2022-03-23 ENCOUNTER — Encounter (HOSPITAL_COMMUNITY): Payer: Self-pay | Admitting: Obstetrics & Gynecology

## 2022-03-23 ENCOUNTER — Inpatient Hospital Stay (HOSPITAL_COMMUNITY)
Admission: AD | Admit: 2022-03-23 | Discharge: 2022-03-27 | DRG: 831 | Disposition: A | Discharge status: Home / Self Care | Payer: BC Managed Care – PPO | Attending: Obstetrics & Gynecology | Admitting: Obstetrics & Gynecology

## 2022-03-23 DIAGNOSIS — Z7982 Long term (current) use of aspirin: Secondary | ICD-10-CM

## 2022-03-23 DIAGNOSIS — Z3A31 31 weeks gestation of pregnancy: Secondary | ICD-10-CM | POA: Diagnosis not present

## 2022-03-23 DIAGNOSIS — O99013 Anemia complicating pregnancy, third trimester: Secondary | ICD-10-CM | POA: Diagnosis present

## 2022-03-23 DIAGNOSIS — O3433 Maternal care for cervical incompetence, third trimester: Secondary | ICD-10-CM | POA: Diagnosis present

## 2022-03-23 DIAGNOSIS — O99113 Other diseases of the blood and blood-forming organs and certain disorders involving the immune mechanism complicating pregnancy, third trimester: Secondary | ICD-10-CM | POA: Diagnosis present

## 2022-03-23 DIAGNOSIS — O99119 Other diseases of the blood and blood-forming organs and certain disorders involving the immune mechanism complicating pregnancy, unspecified trimester: Secondary | ICD-10-CM | POA: Diagnosis present

## 2022-03-23 DIAGNOSIS — O133 Gestational [pregnancy-induced] hypertension without significant proteinuria, third trimester: Secondary | ICD-10-CM | POA: Diagnosis not present

## 2022-03-23 DIAGNOSIS — O99214 Obesity complicating childbirth: Secondary | ICD-10-CM | POA: Diagnosis present

## 2022-03-23 DIAGNOSIS — O99213 Obesity complicating pregnancy, third trimester: Secondary | ICD-10-CM | POA: Diagnosis present

## 2022-03-23 DIAGNOSIS — D573 Sickle-cell trait: Secondary | ICD-10-CM | POA: Diagnosis present

## 2022-03-23 DIAGNOSIS — D6861 Antiphospholipid syndrome: Secondary | ICD-10-CM | POA: Diagnosis present

## 2022-03-23 DIAGNOSIS — Z3A32 32 weeks gestation of pregnancy: Secondary | ICD-10-CM | POA: Diagnosis not present

## 2022-03-23 DIAGNOSIS — O09529 Supervision of elderly multigravida, unspecified trimester: Secondary | ICD-10-CM

## 2022-03-23 DIAGNOSIS — O113 Pre-existing hypertension with pre-eclampsia, third trimester: Principal | ICD-10-CM | POA: Diagnosis present

## 2022-03-23 DIAGNOSIS — O24415 Gestational diabetes mellitus in pregnancy, controlled by oral hypoglycemic drugs: Secondary | ICD-10-CM | POA: Diagnosis present

## 2022-03-23 DIAGNOSIS — O1490 Unspecified pre-eclampsia, unspecified trimester: Secondary | ICD-10-CM | POA: Diagnosis present

## 2022-03-23 DIAGNOSIS — O10013 Pre-existing essential hypertension complicating pregnancy, third trimester: Secondary | ICD-10-CM | POA: Diagnosis present

## 2022-03-23 DIAGNOSIS — O09523 Supervision of elderly multigravida, third trimester: Secondary | ICD-10-CM | POA: Diagnosis not present

## 2022-03-23 DIAGNOSIS — O24419 Gestational diabetes mellitus in pregnancy, unspecified control: Secondary | ICD-10-CM

## 2022-03-23 DIAGNOSIS — O119 Pre-existing hypertension with pre-eclampsia, unspecified trimester: Secondary | ICD-10-CM | POA: Diagnosis present

## 2022-03-23 DIAGNOSIS — O09299 Supervision of pregnancy with other poor reproductive or obstetric history, unspecified trimester: Secondary | ICD-10-CM

## 2022-03-23 LAB — CBC
HCT: 37.3 % (ref 36.0–46.0)
HCT: 36.3 % (ref 36.0–46.0)
Hemoglobin: 12 g/dL (ref 12.0–15.0)
Hemoglobin: 11.7 g/dL — ABNORMAL LOW (ref 12.0–15.0)
MCH: 24.5 pg — ABNORMAL LOW (ref 26.0–34.0)
MCH: 24.6 pg — ABNORMAL LOW (ref 26.0–34.0)
MCHC: 32.2 g/dL (ref 30.0–36.0)
MCHC: 32.2 g/dL (ref 30.0–36.0)
MCV: 76.3 fL — ABNORMAL LOW (ref 80.0–100.0)
MCV: 76.3 fL — ABNORMAL LOW (ref 80.0–100.0)
Platelets: 196 10*3/uL (ref 150–400)
Platelets: 184 10*3/uL (ref 150–400)
RBC: 4.89 MIL/uL (ref 3.87–5.11)
RBC: 4.76 MIL/uL (ref 3.87–5.11)
RDW: 15.4 % (ref 11.5–15.5)
RDW: 15.4 % (ref 11.5–15.5)
WBC: 8.6 10*3/uL (ref 4.0–10.5)
WBC: 9.4 10*3/uL (ref 4.0–10.5)
nRBC: 0 % (ref 0.0–0.2)
nRBC: 0 % (ref 0.0–0.2)

## 2022-03-23 LAB — PROTEIN / CREATININE RATIO, URINE
Creatinine, Urine: 26.45 mg/dL
Total Protein, Urine: <6 mg/dL

## 2022-03-23 LAB — URINALYSIS, ROUTINE W REFLEX MICROSCOPIC
Bilirubin Urine: NEGATIVE
Glucose, UA: NEGATIVE mg/dL
Hgb urine dipstick: NEGATIVE
Ketones, ur: NEGATIVE mg/dL
Nitrite: NEGATIVE
Protein, ur: NEGATIVE mg/dL
Specific Gravity, Urine: 1.003 — ABNORMAL LOW (ref 1.005–1.030)
pH: 6 (ref 5.0–8.0)

## 2022-03-23 LAB — COMPREHENSIVE METABOLIC PANEL
ALT: 20 U/L (ref 0–44)
AST: 23 U/L (ref 15–41)
Albumin: 2.6 g/dL — ABNORMAL LOW (ref 3.5–5.0)
Alkaline Phosphatase: 91 U/L (ref 38–126)
Anion gap: 6 (ref 5–15)
BUN: <5 mg/dL — ABNORMAL LOW (ref 6–20)
CO2: 22 mmol/L (ref 22–32)
Calcium: 8.9 mg/dL (ref 8.9–10.3)
Chloride: 108 mmol/L (ref 98–111)
Creatinine, Ser: 0.65 mg/dL (ref 0.44–1.00)
GFR, Estimated: >60 mL/min (ref >60)
Glucose, Bld: 95 mg/dL (ref 70–99)
Potassium: 4.2 mmol/L (ref 3.5–5.1)
Sodium: 136 mmol/L (ref 135–145)
Total Bilirubin: 0.4 mg/dL (ref 0.3–1.2)
Total Protein: 6.4 g/dL — ABNORMAL LOW (ref 6.5–8.1)

## 2022-03-23 LAB — TYPE AND SCREEN
ABO/RH(D): O POS
Antibody Screen: NEGATIVE

## 2022-03-23 LAB — CREATININE, SERUM
Creatinine, Ser: 0.78 mg/dL (ref 0.44–1.00)
GFR, Estimated: >60 mL/min (ref >60)

## 2022-03-23 LAB — GLUCOSE, CAPILLARY: Glucose-Capillary: 138 mg/dL — ABNORMAL HIGH (ref 70–99)

## 2022-03-23 MED ORDER — ONDANSETRON HCL 4 MG/2ML IJ SOLN: 4.0000 mg | Freq: Four times a day (QID) | INTRAMUSCULAR | Status: DC | PRN

## 2022-03-23 MED ORDER — MAGNESIUM SULFATE BOLUS VIA INFUSION
4.0000 g | Freq: Once | INTRAVENOUS | Status: AC
  Administered 2022-03-23: 4 g via INTRAVENOUS
  Filled 2022-03-23: qty 1000

## 2022-03-23 MED ORDER — LACTATED RINGERS IV BOLUS
1000.0000 mL | Freq: Once | INTRAVENOUS | Status: AC
Start: 2022-03-23 — End: 2022-03-23
  Administered 2022-03-23: 1000 mL via INTRAVENOUS

## 2022-03-23 MED ORDER — CALCIUM CARBONATE ANTACID 500 MG PO CHEW: 2.0000 | CHEWABLE_TABLET | ORAL | Status: DC | PRN

## 2022-03-23 MED ORDER — DOCUSATE SODIUM 100 MG PO CAPS
100.0000 mg | ORAL_CAPSULE | Freq: Every day | ORAL | Status: DC
  Administered 2022-03-24 – 2022-03-26 (×2): 100 mg via ORAL
  Filled 2022-03-23 (×4): qty 1

## 2022-03-23 MED ORDER — HYDRALAZINE HCL 20 MG/ML IJ SOLN: 10.0000 mg | INTRAMUSCULAR | Status: DC | PRN

## 2022-03-23 MED ORDER — ENOXAPARIN SODIUM 60 MG/0.6ML IJ SOSY
60.0000 mg | PREFILLED_SYRINGE | INTRAMUSCULAR | Status: DC
  Administered 2022-03-24 – 2022-03-27 (×4): 60 mg via SUBCUTANEOUS
  Filled 2022-03-23 (×5): qty 0.6

## 2022-03-23 MED ORDER — SODIUM CHLORIDE 0.9% FLUSH: 3.0000 mL | INTRAVENOUS | Status: DC | PRN

## 2022-03-23 MED ORDER — NIFEDIPINE 10 MG PO CAPS
10.0000 mg | ORAL_CAPSULE | Freq: Once | ORAL | Status: DC
Start: 2022-03-24 — End: 2022-03-24
  Filled 2022-03-23: qty 1

## 2022-03-23 MED ORDER — BETAMETHASONE SOD PHOS & ACET 6 (3-3) MG/ML IJ SUSP
12.0000 mg | INTRAMUSCULAR | Status: AC
  Administered 2022-03-23 – 2022-03-24 (×2): 12 mg via INTRAMUSCULAR
  Filled 2022-03-23 (×2): qty 5

## 2022-03-23 MED ORDER — PRENATAL MULTIVITAMIN CH
1.0000 | ORAL_TABLET | Freq: Every day | ORAL | Status: DC
  Administered 2022-03-23 – 2022-03-27 (×4): 1 via ORAL
  Filled 2022-03-23 (×4): qty 1

## 2022-03-23 MED ORDER — METFORMIN HCL 500 MG PO TABS
500.0000 mg | ORAL_TABLET | Freq: Two times a day (BID) | ORAL | Status: DC
  Administered 2022-03-23 – 2022-03-25 (×4): 500 mg via ORAL
  Filled 2022-03-23 (×5): qty 1

## 2022-03-23 MED ORDER — SODIUM CHLORIDE 0.9% FLUSH
3.0000 mL | Freq: Two times a day (BID) | INTRAVENOUS | Status: DC
  Administered 2022-03-25 – 2022-03-27 (×4): 3 mL via INTRAVENOUS

## 2022-03-23 MED ORDER — NIFEDIPINE ER OSMOTIC RELEASE 30 MG PO TB24: 30.0000 mg | ORAL_TABLET | Freq: Every day | ORAL | Status: DC

## 2022-03-23 MED ORDER — ZOLPIDEM TARTRATE 5 MG PO TABS: 5.0000 mg | ORAL_TABLET | Freq: Every evening | ORAL | Status: DC | PRN

## 2022-03-23 MED ORDER — MAGNESIUM SULFATE 40 GM/1000ML IV SOLN
2.0000 g/h | INTRAVENOUS | Status: AC
  Administered 2022-03-23 – 2022-03-25 (×3): 2 g/h via INTRAVENOUS
  Filled 2022-03-23 (×2): qty 1000

## 2022-03-23 MED ORDER — ACETAMINOPHEN 500 MG PO TABS
1000.0000 mg | ORAL_TABLET | Freq: Once | ORAL | Status: AC
  Administered 2022-03-23: 1000 mg via ORAL
  Filled 2022-03-23: qty 2

## 2022-03-23 MED ORDER — LACTATED RINGERS IV SOLN
INTRAVENOUS | Status: DC
Start: 2022-03-23 — End: 2022-03-27

## 2022-03-23 MED ORDER — MAGNESIUM SULFATE 40 GM/1000ML IV SOLN
INTRAVENOUS | Status: AC
  Filled 2022-03-23: qty 1000

## 2022-03-23 MED ORDER — SODIUM CHLORIDE 0.9 % IV SOLN: 250.0000 mL | INTRAVENOUS | Status: DC | PRN

## 2022-03-23 MED ORDER — HYDRALAZINE HCL 20 MG/ML IJ SOLN: 5.0000 mg | INTRAMUSCULAR | Status: DC | PRN

## 2022-03-23 MED ORDER — LABETALOL HCL 5 MG/ML IV SOLN: 20.0000 mg | INTRAVENOUS | Status: DC | PRN

## 2022-03-23 MED ORDER — ACETAMINOPHEN 325 MG PO TABS
650.0000 mg | ORAL_TABLET | ORAL | Status: DC | PRN
  Administered 2022-03-23 – 2022-03-24 (×2): 650 mg via ORAL
  Filled 2022-03-23 (×3): qty 2

## 2022-03-23 MED ORDER — LABETALOL HCL 5 MG/ML IV SOLN: 40.0000 mg | INTRAVENOUS | Status: DC | PRN

## 2022-03-23 NOTE — MAU Provider Note (Signed)
?History  ?  ? ?CSN: [id number] ? ?Arrival date and time: 03/23/22 1038 ? ? Event Date/Time  ? First Provider Initiated Contact with Patient 03/23/22 1207   ?  ? ?Chief Complaint  ?Patient presents with  ? Headache  ? Hypertension  ? ?HPI ? ?[name] is a 41 y.o. female G79P0100 @ [redacted]w[redacted]d with a history of cervical incompetence, and GDM, here in MAU with elevated BP's and Headache. She was seen in the office today at [facility] and was sent here for further evaluation. She reports a headache that is frontal which she rates 3/10. This headache started yesterday, however she has had off and on headaches throughout the month that has been relieved with tylenol. She has no vision changes or abdominal pain.  ? ?Reports good fetal movement, and no bleeding.  ? ?OB History   ? ? Gravida  ?2  ? Para  ?1  ? Term  ?   ? Preterm  ?1  ? AB  ?   ? Living  ?0  ?  ? ? SAB  ?   ? IAB  ?   ? Ectopic  ?   ? Multiple  ?   ? Live Births  ?0  ?   ?  ? Obstetric Comments  ?With first did not know she was preg, was still having "periods" every month; D&C for retained placenta  ?  ? ?  ? ? ?Past Medical History:  ?Diagnosis Date  ? Anemia   ? Diabetes mellitus without complication (HCC)   ? Sickle cell trait (HCC)   ? ? ?Past Surgical History:  ?Procedure Laterality Date  ? CERVICAL CERCLAGE Bilateral 11/19/2021  ? Procedure: CERCLAGE CERVICAL;  Surgeon: Osborn Coho, MD;  Location: [facility] LD ORS;  Service: Gynecology;  Laterality: Bilateral;  ? DILATION AND CURETTAGE OF UTERUS    ? ? ?Family History  ?Problem Relation Age of Onset  ? Hypertension Mother   ? Diabetes Mother   ? Heart disease Mother   ? Healthy Father   ? ? ?Social History  ? ?Tobacco Use  ? Smoking status: Never  ? Smokeless tobacco: Never  ?Vaping Use  ? Vaping Use: Never used  ?Substance Use Topics  ? Alcohol use: Not Currently  ? Drug use: Never  ? ? ?Allergies: No Known Allergies ? ?Medications Prior to Admission  ?Medication Sig Dispense Refill Last Dose  ? aspirin  EC 81 MG tablet Take 81 mg by mouth daily. Swallow whole.   03/22/2022  ? Ferrous Sulfate (IRON PO) Take 2 tablets by mouth in the morning.   03/22/2022  ? Prenatal Vit-Fe Fumarate-FA (PRENATAL VITAMIN PO) Take 1 tablet by mouth daily with lunch.   03/22/2022  ? ?Results for orders placed or performed during the hospital encounter of 03/23/22 (from the past 48 hour(s))  ?CBC     Status: Abnormal  ? Collection Time: 03/23/22 11:42 AM  ?Result Value Ref Range  ? WBC 8.6 4.0 - 10.5 K/uL  ? RBC 4.89 3.87 - 5.11 MIL/uL  ? Hemoglobin 12.0 12.0 - 15.0 g/dL  ? HCT 37.3 36.0 - 46.0 %  ? MCV 76.3 (L) 80.0 - 100.0 fL  ? MCH 24.5 (L) 26.0 - 34.0 pg  ? MCHC 32.2 30.0 - 36.0 g/dL  ? RDW 15.4 11.5 - 15.5 %  ? Platelets 196 150 - 400 K/uL  ? nRBC 0.0 0.0 - 0.2 %  ?  Comment: Performed at [facility], [address] [place], [place] [address]  ?Comprehensive metabolic panel     Status: Abnormal  ? Collection Time: 03/23/22 11:42 AM  ?Result Value Ref Range  ? Sodium 136 135 - 145 mmol/L  ? Potassium 4.2 3.5 - 5.1 mmol/L  ? Chloride 108 98 - 111 mmol/L  ? CO2 22 22 - 32 mmol/L  ? Glucose, Bld 95 70 - 99 mg/dL  ?  Comment: Glucose reference range applies only to samples taken after fasting for at least 8 hours.  ? BUN <5 (L) 6 - 20 mg/dL  ? Creatinine, Ser 0.65 0.44 - 1.00 mg/dL  ? Calcium 8.9 8.9 - 10.3 mg/dL  ? Total Protein 6.4 (L) 6.5 - 8.1 g/dL  ? Albumin 2.6 (L) 3.5 - 5.0 g/dL  ? AST 23 15 - 41 U/L  ? ALT 20 0 - 44 U/L  ? Alkaline Phosphatase 91 38 - 126 U/L  ? Total Bilirubin 0.4 0.3 - 1.2 mg/dL  ? GFR, Estimated >60 >60 mL/min  ?  Comment: (NOTE) ?Calculated using the CKD-EPI Creatinine Equation (2021) ?  ? Anion gap 6 5 - 15  ?  Comment: Performed at [facility], [address] [place], [place] [address]  ?Urinalysis, Routine w reflex microscopic Urine, Clean Catch     Status: Abnormal  ? Collection Time: 03/23/22 11:48 AM  ?Result Value Ref Range  ? Color, Urine STRAW (A) YELLOW  ? APPearance CLEAR CLEAR  ?  Specific Gravity, Urine 1.003 (L) 1.005 - 1.030  ? pH 6.0 5.0 - 8.0  ? Glucose, UA NEGATIVE NEGATIVE mg/dL  ? Hgb urine dipstick NEGATIVE NEGATIVE  ? Bilirubin Urine NEGATIVE NEGATIVE  ? Ketones, ur NEGATIVE NEGATIVE mg/dL  ? Protein, ur NEGATIVE NEGATIVE mg/dL  ? Nitrite NEGATIVE NEGATIVE  ? Leukocytes,Ua MODERATE (A) NEGATIVE  ? RBC / HPF 0-5 0 - 5 RBC/hpf  ? WBC, UA 0-5 0 - 5 WBC/hpf  ? Bacteria, UA FEW (A) NONE SEEN  ? Squamous Epithelial / LPF 0-5 0 - 5  ?  Comment: Performed at [facility], [address] [place], [place] [address]  ?  ? ?Review of Systems  ?Eyes:  Negative for photophobia and visual disturbance.  ?Gastrointestinal:  Negative for abdominal pain and nausea.  ?Genitourinary:  Negative for vaginal bleeding, vaginal discharge and vaginal pain.  ?Neurological:  Positive for headaches.  ?Physical Exam  ? ?Blood pressure (!) 147/82, pulse 97, temperature 98.4 ?F (36.9 ?C), temperature source Oral, resp. rate 20, height 5\' 3"  (1.6 m), weight 113.4 kg, last menstrual period 08/01/2021, SpO2 100 %. ? ?Patient Vitals for the past 24 hrs: ? BP Temp Temp src Pulse Resp SpO2 Height Weight  ?03/23/22 1531 (!) 148/89 -- -- (!) 109 -- -- -- --  ?03/23/22 1509 (!) 143/73 -- -- (!) 104 -- -- -- --  ?03/23/22 1446 (!) 149/81 -- -- (!) 106 -- -- -- --  ?03/23/22 1431 (!) 147/82 -- -- 97 -- -- -- --  ?03/23/22 1416 (!) 145/73 -- -- (!) 106 -- -- -- --  ?03/23/22 1401 (!) 153/83 -- -- 99 -- -- -- --  ?03/23/22 1346 (!) 150/76 -- -- 97 -- -- -- --  ?03/23/22 1331 (!) 158/98 -- -- 99 -- -- -- --  ?03/23/22 1316 (!) 157/80 -- -- 99 -- -- -- --  ?03/23/22 1301 (!) 158/73 -- -- 91 -- -- -- --  ?03/23/22 1246 (!) 157/98 -- -- 99 -- -- -- --  ?  03/23/22 1231 (!) 160/82 -- -- 91 -- 100 % -- --  ?03/23/22 1216 (!) 156/75 -- -- 88 -- -- -- --  ?03/23/22 1201 (!) 153/82 -- -- 92 -- -- -- --  ?03/23/22 1146 (!) 151/84 -- -- 97 -- -- -- --  ?03/23/22 1131 (!) 145/87 -- -- 98 -- -- -- --  ?03/23/22 1117 (!) 150/75 -- --  (!) 103 -- -- -- --  ?03/23/22 1055 (!) 147/90 98.4 ?F (36.9 ?C) Oral 99 20 97 % 5\' 3"  (1.6 m) 113.4 kg  ?  ?Physical Exam ?Constitutional:   ?   General: She is not in acute distress. ?   Appearance: She is well-developed. She is not ill-appearing, toxic-appearing or diaphoretic.  ?Skin: ?   General: Skin is warm.  ?Neurological:  ?   Mental Status: She is alert and oriented to person, place, and time.  ?   Deep Tendon Reflexes: Reflexes normal (Negative clonus.).  ? ?Fetal Tracing: ?Baseline: 140 bpm ?Variability: Moderate  ?Accelerations: 15x15 ?Decelerations: 1418: prolonged variable lasting 2.5 minutes.  ?Toco: Quiet.  ? ?MAU Course  ?Procedures ?None ? ?MDM ? ?No prenatal records under media, limited information under care everywhere.  ?PCR pending: delay due to all PCR's being shipped to [facility]  ?CBC and CMP ordered  ?Discussed patient with Dr. [facility] and Dr. Vergie Living who are both agreeable to admission/observation to [facility].  Briefly discussed magnesium with Dr. Mora Appl; she will decide further when she is moved to [facility]. Of note the patient has had only 1 severe range BP in MAU.  ? ?Assessment and Plan  ? ?A: ? ?1. [redacted] weeks gestation of pregnancy   ?2. Gestational hypertension, third trimester   ?  ? ?P: ? ?PCR pending  ?Admit to [facility] ?[facility]. [facility] to manage care.  ? ? ?Mora Appl, NP ?03/23/2022 ?3:46 PM ? ?

## 2022-03-23 NOTE — MAU Note (Signed)
Dawn Vaughn is a 41 y.o. at [redacted]w[redacted]d here in MAU reporting: started with a HA last night.  Checked her BP 173/103.  Drank 2 bottles of water and took 1 Tylenol (discussed adult dosing).  Woke up with HA this morning, BP was 163/101.  Has not ate anything.  Been drinking.  Had appt this morning, checked BP a couple times, lowest  142/83. Denies visual changes, epigastric pain, states feet are swollen.  Denies bleeding or leaking, reports +FM ? ?Onset of complaint: yesterday ?Pain score: HA 3/4 ?Vitals:  ? 03/23/22 1055  ?BP: (!) 147/90  ?Pulse: 99  ?Resp: 20  ?Temp: 98.4 ?F (36.9 ?C)  ?SpO2: 97%  ?   ?FHT:146 ?Lab orders placed from triage:  urine ?

## 2022-03-23 NOTE — Progress Notes (Signed)
Subjective:   ? ?Doing well, mild HA resolved with Tylenol. Denies RUQ and visual changes.  ? ?Objective:   ? ?VS: BP (!) 159/87   Pulse (!) 105   Temp 97.9 ?F (36.6 ?C) (Oral)   Resp 17   Ht 5\' 3"  (1.6 m)   Wt 113.4 kg   LMP 08/01/2021   SpO2 97%   BMI 44.29 kg/m?  ?FHR : baseline 135 w/ interruptions in the tracing/ variability moderate / accelerations present / absent decelerations ?Toco: occasional ?Membranes: intact ?  ? ? ?Assessment/Plan:  ? ?41 y.o. G2P0100 [redacted]w[redacted]d ?CHTN w/ superimposed pre-eclampsia w/ severe features (HA) ?   -Magnesium sulfate now, Procardia after mag discontinue ?A2GDM ?Cervical insufficieny ?   -cerclage in place ?   -vaginal progesterone ? ?Preeclampsia:  on magnesium sulfate and no signs or symptoms of toxicity ?Fetal Wellbeing:  Category I ?Betamethasone second dose 4/28 @ 1815 ?I/D:   GBS pending ? ? ?5/28 MSN, CNM ?03/23/2022 11:16 PM ? ?

## 2022-03-23 NOTE — H&P (Signed)
ANTEPARTUM HISTORY AND PHYSICAL ? ?Admission Date: 03/23/2022 10:38 AM  ?Admit Diagnosis: Preeclampsia [O14.90] ?Preeclampsia, third trimester [O14.93]  ? ?Dawn Vaughn is a 41 y.o. G2P0100 at 32 weeks presented to MAU from office for preeclampsia workup secondary to severe range blood pressures at home and elevated blood pressures in the office. Patient reports a headache currently not severe 3/10, frontal in nature that began yesterday. Patient reports that she has had headaches off and on for a month relieved with OTC tylenol.  She denies any visual changes, as well as epigastric or right upper quadrant pain. Patient denies contractions, leaking of fluid or vaginal bleeding and she endorses active FM. ? ?History of current pregnancy: ?Prenatal Care with: [facility] ?Patient entered prenatal care at 7 wks.   ?EDC 05/18/22 by 7 week 2 day ultrasound not congruent with LMP of 08/11/21. ?Anatomy scan:  Level 2 by MFM 18 wks, complete w/ anterior placenta.   ?MFM consulted for high risk nature of pregnancy with serial growth ultrasounds ? ?Significant prenatal problems: ?1.Chronic hypertension affecting pregnancy ?2. Maternal obesity BMI 44.5 ?3. A2GDM - on metformin diagnosed by glucosuria and random fingerstick in office ?4. History of IUFD second trimester 25 weeks ?5. Positive B2GP1 IgG & IgM antibodies - ?diagnostic criteria for antiphospholipid syndrome NEG lupus anticoagulant  ?6. Sickle Cell Trait ?7. Cervical insufficiency (short cervix) -         cerclage in place (previously on Makena & vaginal Progesterone then just Progesterone) ?8. Advanced Maternal Age ?9. STI during pregnancy - Trichomoniasis - treated test of cure negative ?10. Acute UTI during pregnancy - Klebsiella pneumoniae - treated, follow up cx neg ?11. Vitamin D deficiency ? ?Prenatal Labs: ?ABO, Rh: --/--/O POS (12/23 1339) ?Antibody: NEG (12/23 1339) ?Rubella:   IMMUNE ?RPR: NON REACTIVE (12/23 1339)  ?HBsAg:   NON-REACTIVE ?HIV:   NEG ?GTT:  PASS 104 ?GBS:   UNK ?GC/CHL: NEG ?Genetics: Low risk NIPS ?Vaccines: ?Tdap: declined ?Flu Y ?Covid: Unknown ? ?  ? ?Medical / Surgical History: ?Past Obstetric history:  ?OB History  ?Gravida Para Term Preterm AB Living  ?2 1   1    0  ?SAB IAB Ectopic Multiple Live Births  ?        0  ?  ?# Outcome Date GA Lbr Len/2nd Weight Sex Delivery Anes PTL Lv  ?2 Current           ?1 Preterm  [redacted]w[redacted]d       FD  ?  ?Obstetric Comments  ?With first did not know she was preg, was still having "periods" every month; D&C for retained placenta  ? ?Past medical history:  ?Past Medical History:  ?Diagnosis Date  ? Anemia   ? Diabetes mellitus without complication (HCC)   ? Sickle cell trait (HCC)   ?  ?Past surgical history:  ?Past Surgical History:  ?Procedure Laterality Date  ? CERVICAL CERCLAGE Bilateral 11/19/2021  ? Procedure: CERCLAGE CERVICAL;  Surgeon: 11/21/2021, MD;  Location: [facility] LD ORS;  Service: Gynecology;  Laterality: Bilateral;  ? DILATION AND CURETTAGE OF UTERUS    ? ?Family History:  ?Family History  ?Problem Relation Age of Onset  ? Hypertension Mother   ? Diabetes Mother   ? Heart disease Mother   ? Healthy Father   ?  ?Social History:  reports that she has never smoked. She has never used smokeless tobacco. She reports that she does not currently use alcohol. She reports that she does not use  drugs. ? ?Allergies: ?Patient has no known allergies. ?  ?Current Medications at time of admission:  ?Prior to Admission medications   ?Medication Sig Start Date End Date Taking? Authorizing Provider  ?aspirin EC 81 MG tablet Take 81 mg by mouth daily. Swallow whole.   Yes [provider]  ?Ferrous Sulfate (IRON PO) Take 2 tablets by mouth in the morning.   Yes [provider]  ?Prenatal Vit-Fe Fumarate-FA (PRENATAL VITAMIN PO) Take 1 tablet by mouth daily with lunch. ?   Yes [provider]  ?Metformin 500 mg BID   Yes   ? ? ?Review of Systems: ?Constitutional: Negative   ?HENT: Negative    ?Eyes: Negative   ?Respiratory: Negative   ?Cardiovascular: Negative   ?Gastrointestinal: Negative  ?Genitourinary: neg for bloody show, neg for LOF   ?Musculoskeletal: Negative   ?Skin: Negative   ?Neurological: POS for HA   ?Endo/Heme/Allergies: Negative   ?Psychiatric/Behavioral: Negative  ? ? ?Physical Exam: ?VS: Blood pressure (!) 149/87, pulse (!) 108, temperature 98.2 ?F (36.8 ?C), temperature source Oral, resp. rate 16, height 5\' 3"  (1.6 m), weight 113.4 kg, last menstrual period 08/01/2021, SpO2 100 %. ?AAO x3, no signs of distress ?GI: Abdomen gravid, non-tender, non-distended ?GU: Cervical exam deferred ?Extremities: 1+ edema, negative for pain, tenderness, and cords ?Neuro: 2+ DTRs bilaterally, no clonus ? ?Labs:   ?CBC ?   ?Component Value Date/Time  ? WBC 8.6 03/23/2022 1142  ? RBC 4.89 03/23/2022 1142  ? HGB 12.0 03/23/2022 1142  ? HCT 37.3 03/23/2022 1142  ? PLT 196 03/23/2022 1142  ? MCV 76.3 (L) 03/23/2022 1142  ? MCH 24.5 (L) 03/23/2022 1142  ? MCHC 32.2 03/23/2022 1142  ? RDW 15.4 03/23/2022 1142  ? LYMPHSABS 0.9 08/12/2007 1141  ? MONOABS 0.6 08/12/2007 1141  ? EOSABS 0.0 08/12/2007 1141  ? BASOSABS 0.0 08/12/2007 1141  ?CMP  ?   ?Component Value Date/Time  ? NA 136 03/23/2022 1142  ? K 4.2 03/23/2022 1142  ? CL 108 03/23/2022 1142  ? CO2 22 03/23/2022 1142  ? GLUCOSE 95 03/23/2022 1142  ? BUN <5 (L) 03/23/2022 1142  ? CREATININE 0.65 03/23/2022 1142  ? CALCIUM 8.9 03/23/2022 1142  ? PROT 6.4 (L) 03/23/2022 1142  ? ALBUMIN 2.6 (L) 03/23/2022 1142  ? AST 23 03/23/2022 1142  ? ALT 20 03/23/2022 1142  ? ALKPHOS 91 03/23/2022 1142  ? BILITOT 0.4 03/23/2022 1142  ? GFRNONAA >60 03/23/2022 1142  ?  ?Urinalysis ?   ?Component Value Date/Time  ? COLORURINE STRAW (A) 03/23/2022 1148  ? APPEARANCEUR CLEAR 03/23/2022 1148  ? LABSPEC 1.003 (L) 03/23/2022 1148  ? PHURINE 6.0 03/23/2022 1148  ? GLUCOSEU NEGATIVE 03/23/2022 1148  ? HGBUR NEGATIVE 03/23/2022 1148  ? BILIRUBINUR NEGATIVE 03/23/2022 1148  ?  KETONESUR NEGATIVE 03/23/2022 1148  ? PROTEINUR NEGATIVE 03/23/2022 1148  ? UROBILINOGEN 0.2 08/12/2007 1217  ? NITRITE NEGATIVE 03/23/2022 1148  ? LEUKOCYTESUR MODERATE (A) 03/23/2022 1148  ? ? Protein:Creatinine Ratio: pending ? ?Last ultrasound dated 03/10/22 (office): Singleton Pregnancy, vertex presentation, anterior placenta. Cervix measures approximately 2.5 cm transabdominally. Cerclage is visualized. Amniotic fluid normal AFI=7.8cm ?EFW=1477 grams 3 pounds 4 oz 30% ? ?Fetal Non-Stress Test: ?FHR: baseline rate 145 / variability moderate / accelerations present / absent decelerations ?TOCO: none ? ?  ? ? ?Assessment: ?41 y.o. G2P0100 3868w6d with new diagnosis of chronic hypertension with superimposed preeclampsia ?Patient is high risk for h/o IUFD, obesity, A2GDM, antiphospholipid antibody syndrome. Cervical insufficiency  with cerclage in place. ? ?Plan:  ?Admit to Antepartum service ?Routine admission orders ?Will check serial blood pressures q 2 hours x 4 occurrences, if any severe range will start magnesium sulfate IV for seizure prophylaxis.  ?Continuous monitoring  ?Will hold IV hydration for now and saline lock IV ?Repeat pre E labs in am; awaiting protein: creatinine ratio result ?MFM consultation - with BPP ? ?NICU consultation ?Betamethasone x 24 hours for fetal lung maturity ?Preeclampsia precautions with strict I's&O's ?GBS culture in case delivery is required ?Urine culture as UA with large leukocytes ? ?Essie Hart MD ?03/23/2022  ?5:39 PM  ?

## 2022-03-24 DIAGNOSIS — O09523 Supervision of elderly multigravida, third trimester: Secondary | ICD-10-CM

## 2022-03-24 DIAGNOSIS — D6861 Antiphospholipid syndrome: Secondary | ICD-10-CM

## 2022-03-24 DIAGNOSIS — O113 Pre-existing hypertension with pre-eclampsia, third trimester: Principal | ICD-10-CM

## 2022-03-24 DIAGNOSIS — O99113 Other diseases of the blood and blood-forming organs and certain disorders involving the immune mechanism complicating pregnancy, third trimester: Secondary | ICD-10-CM

## 2022-03-24 DIAGNOSIS — Z3A32 32 weeks gestation of pregnancy: Secondary | ICD-10-CM

## 2022-03-24 LAB — GLUCOSE, CAPILLARY
Glucose-Capillary: 135 mg/dL — ABNORMAL HIGH (ref 70–99)
Glucose-Capillary: 109 mg/dL — ABNORMAL HIGH (ref 70–99)
Glucose-Capillary: 118 mg/dL — ABNORMAL HIGH (ref 70–99)
Glucose-Capillary: 185 mg/dL — ABNORMAL HIGH (ref 70–99)

## 2022-03-24 LAB — CBC
HCT: 37.7 % (ref 36.0–46.0)
Hemoglobin: 12.2 g/dL (ref 12.0–15.0)
MCH: 24.5 pg — ABNORMAL LOW (ref 26.0–34.0)
MCHC: 32.4 g/dL (ref 30.0–36.0)
MCV: 75.9 fL — ABNORMAL LOW (ref 80.0–100.0)
Platelets: 192 10*3/uL (ref 150–400)
RBC: 4.97 MIL/uL (ref 3.87–5.11)
RDW: 15.6 % — ABNORMAL HIGH (ref 11.5–15.5)
WBC: 13.3 10*3/uL — ABNORMAL HIGH (ref 4.0–10.5)
nRBC: 0 % (ref 0.0–0.2)

## 2022-03-24 LAB — COMPREHENSIVE METABOLIC PANEL
ALT: 20 U/L (ref 0–44)
AST: 34 U/L (ref 15–41)
Albumin: 2.6 g/dL — ABNORMAL LOW (ref 3.5–5.0)
Alkaline Phosphatase: 91 U/L (ref 38–126)
Anion gap: 9 (ref 5–15)
BUN: 6 mg/dL (ref 6–20)
CO2: 19 mmol/L — ABNORMAL LOW (ref 22–32)
Calcium: 7.8 mg/dL — ABNORMAL LOW (ref 8.9–10.3)
Chloride: 107 mmol/L (ref 98–111)
Creatinine, Ser: 0.65 mg/dL (ref 0.44–1.00)
GFR, Estimated: >60 mL/min (ref >60)
Glucose, Bld: 124 mg/dL — ABNORMAL HIGH (ref 70–99)
Potassium: 4.2 mmol/L (ref 3.5–5.1)
Sodium: 135 mmol/L (ref 135–145)
Total Bilirubin: 0.5 mg/dL (ref 0.3–1.2)
Total Protein: 6.4 g/dL — ABNORMAL LOW (ref 6.5–8.1)

## 2022-03-24 MED ORDER — BUTALBITAL-APAP-CAFFEINE 50-325-40 MG PO TABS: 2.0000 | ORAL_TABLET | ORAL | Status: DC | PRN

## 2022-03-24 MED ORDER — ASPIRIN EC 81 MG PO TBEC
81.0000 mg | DELAYED_RELEASE_TABLET | Freq: Every day | ORAL | Status: DC
Start: 2022-03-25 — End: 2022-03-27
  Administered 2022-03-25 – 2022-03-27 (×3): 81 mg via ORAL
  Filled 2022-03-24 (×3): qty 1

## 2022-03-24 NOTE — Progress Notes (Signed)
Subjective:   ? ?Feeling better today than yesterday. Denies HA today, no visual changes and no RUQ pain.  ? ?Objective:   ? ?VS: BP (!) 148/87 (BP Location: Right Arm)   Pulse (!) 108   Temp 98.8 ?F (37.1 ?C) (Oral)   Resp 16   Ht 5\' 3"  (1.6 m)   Wt 113.4 kg   LMP 08/01/2021   SpO2 98%   BMI 44.29 kg/m?  ?FHR : baseline 140 / variability moderate / accelerations present / absent decelerations ?Toco: none per TOCO or palpated ?Membranes: intact ?Extremities: neg for edema, pain, tenderness, or cords ?Reflexes: +2 ? ?Assessment/Plan:  ? ?41 y.o. G2P0100 [redacted]w[redacted]d ? ?CHTN with superimposed pre-eclampsia with severe features ?   -magnesium 2G/hr stop time 03/24/22 @ 1930 ?   -mild range BP ?A2DM ?   -diabetic consult pending ?   -continue Metformin 500 mg BID ?Antiphospholipid syndrome ?Maternal obesity ?Hx IUFD @ 25 wks ?   -cerclage in place ? ?MFM recommendations: ?1) continue magnesium sulfate- for seizure prophylaxis until BMZ completed.  ?2) Continuous fetal monitoring ?3) Complete BMZ course Dose # 2 03/24/22 @ 1811 ?4) NICU consultation ?5) Initiate oral Procardia with the goal of avg BP 135/85, continue to titrate until blood pressure is < 145/95 consistently. ?6) If her blood pressure is well managed her headache resolves she can be discharged with close follow up.  ?7) If her blood pressure is not responsive and her headache persist consider delivery. ?8) If discharged home initiate 2x weekly testing with plan for delivery at 37 weeks.  ?9) If her headache continues to be intermittent remain in house until 34 weeks. ?10) Continue blood sugar control per diabetes coordinator. ?11) Continue daily ASA and Lovenox. ? ?03/26/22 MSN, CNM ?03/24/2022 9:32 PM ? ?

## 2022-03-24 NOTE — Consult Note (Addendum)
I connected with  Dawn Vaughn on 03/24/22 by a telephonically and verified that I am speaking with the correct person using two identifiers. ?  ?I discussed the limitations of evaluation and management by telemedicine. The patient expressed understanding and agreed to proceed. ?  ? ?MFM Consultation ?  ?Dawn Vaughn is a 78 G2P0 who is here at 36 w 1 d with an EDD of 05/18/22 for the diagnosis of preeclampsia vis exacerbation of chronic hypertension ?  ?She is seen at the request of Dr. Essie Hart. ?  ?Dawn Vaughn's pregnancy is complicated by a prior 25 week IUFD, antiphospholipid antibody syndrome, elevated BMI and A2GDM.  ? ?She was seen early in pregnancy in consultation by myself so she is very familiar. ? ?Her pregnancy overall has gone well. She was treated with low dose ASA and Lovenox for APLAS. Her blood sugars have been controlled on Metformin. ? ?On Tuesday she had a new on set headache. She reported a home blood pressure in the 170's. She was then seen in clinic with elevated blood pressure in which she was advised to go the MAU for further evaluation. Her blood pressure remained elevated with an occasional severe ranged blood pressure. She was admitted and placed on magnesium received BMZ and started on Procardia for blood pressure control. Her labs are normal.  ? ?Her headache began to resolve yesterday but is mildly present this morning. She denies nausea or vomiting, RUQ pain or visual changes. ? ? ?  03/24/2022  ?  8:06 AM 03/24/2022  ?  5:00 AM 03/24/2022  ?  4:00 AM  ?Vitals with BMI  ?Systolic 151 144 169  ?Diastolic 82 87 79  ?Pulse 109 100 102  ? ? ?  Latest Ref Rng & Units 03/24/2022  ?  8:21 AM 03/23/2022  ?  5:33 PM 03/23/2022  ? 11:42 AM  ?CBC  ?WBC 4.0 - 10.5 K/uL 13.3   9.4   8.6    ?Hemoglobin 12.0 - 15.0 g/dL 67.8   93.8   10.1    ?Hematocrit 36.0 - 46.0 % 37.7   36.3   37.3    ?Platelets 150 - 400 K/uL 192   184   196    ? ? ?  Latest Ref Rng & Units 03/24/2022  ?  8:21 AM 03/23/2022  ?   5:33 PM 03/23/2022  ? 11:42 AM  ?CMP  ?Glucose 70 - 99 mg/dL 751    95    ?BUN 6 - 20 mg/dL 6    <5    ?Creatinine 0.44 - 1.00 mg/dL 0.25   8.52   7.78    ?Sodium 135 - 145 mmol/L 135    136    ?Potassium 3.5 - 5.1 mmol/L 4.2    4.2    ?Chloride 98 - 111 mmol/L 107    108    ?CO2 22 - 32 mmol/L 19    22    ?Calcium 8.9 - 10.3 mg/dL 7.8    8.9    ?Total Protein 6.5 - 8.1 g/dL 6.4    6.4    ?Total Bilirubin 0.3 - 1.2 mg/dL 0.5    0.4    ?Alkaline Phos 38 - 126 U/L 91    91    ?AST 15 - 41 U/L 34    23    ?ALT 0 - 44 U/L 20    20    ? ?Past Medical History:  ?Diagnosis Date  ? Anemia   ?  Diabetes mellitus without complication (HCC)   ? Sickle cell trait (HCC)   ? ?Past Surgical History:  ?Procedure Laterality Date  ? CERVICAL CERCLAGE Bilateral 11/19/2021  ? Procedure: CERCLAGE CERVICAL;  Surgeon: Osborn Cohooberts, Angela, MD;  Location: [facility]S;  Service: Gynecology;  Laterality: Bilateral;  ? DILATION AND CURETTAGE OF UTERUS    ? ?Family History  ?Problem Relation Age of Onset  ? Hypertension Mother   ? Diabetes Mother   ? Heart disease Mother   ? Healthy Father   ? ? ?Current Facility-Administered Medications (Endocrine & Metabolic):  ?  betamethasone acetate-betamethasone sodium phosphate (CELESTONE) injection 12 mg ?  metFORMIN (GLUCOPHAGE) tablet 500 mg ? ? ?Current Facility-Administered Medications (Cardiovascular):  ?  hydrALAZINE (APRESOLINE) injection 5 mg **AND** hydrALAZINE (APRESOLINE) injection 10 mg **AND** labetalol (NORMODYNE) injection 20 mg **AND** labetalol (NORMODYNE) injection 40 mg **AND** Measure blood pressure ?  NIFEdipine (PROCARDIA) capsule 10 mg ? ? ? ? ?Current Facility-Administered Medications (Analgesics):  ?  acetaminophen (TYLENOL) tablet 650 mg ? ? ?Current Facility-Administered Medications (Hematological):  ?  enoxaparin (LOVENOX) injection 60 mg ? ? ?Current Facility-Administered Medications (Other):  ?  0.9 %  sodium chloride infusion ?  calcium carbonate (TUMS - dosed in mg elemental  calcium) chewable tablet 400 mg of elemental calcium ?  docusate sodium (COLACE) capsule 100 mg ?  lactated ringers infusion ?  magnesium sulfate 40 grams in SWI 1000 mL OB infusion ?  ondansetron (ZOFRAN) injection 4 mg ?  prenatal multivitamin tablet 1 tablet ?  sodium chloride flush (NS) 0.9 % injection 3 mL ?  sodium chloride flush (NS) 0.9 % injection 3 mL ?  zolpidem (AMBIEN) tablet 5 mg ? ?No current outpatient medications on file. ?Social History  ? ?Socioeconomic History  ? Marital status: Single  ?  Spouse name: Not on file  ? Number of children: Not on file  ? Years of education: Not on file  ? Highest education level: Not on file  ?Occupational History  ? Not on file  ?Tobacco Use  ? Smoking status: Never  ? Smokeless tobacco: Never  ?Vaping Use  ? Vaping Use: Never used  ?Substance and Sexual Activity  ? Alcohol use: Not Currently  ? Drug use: Never  ? Sexual activity: Not Currently  ?Other Topics Concern  ? Not on file  ?Social History Narrative  ? Not on file  ? ?Social Determinants of Health  ? ?Financial Resource Strain: Not on file  ?Food Insecurity: Not on file  ?Transportation Needs: Not on file  ?Physical Activity: Not on file  ?Stress: Not on file  ?Social Connections: Not on file  ?Intimate Partner Violence: Not on file  ? ?No Known Allergies ? ?  ?Impression/Counseling: ?  ?I discussed the diagnosis, evaluation and management of preeclampsia with severe features vs chronic hypertension exacerbation. ?  ?I reviewed that the diagnosis includes elevated blood pressure with one of the following including abnormal labs, severe headache, severe hypertension (>160/110) or pulmonary edema. She had a headache that is some what responsive to medical therapy. ? ?I explained that for those women with unresolved headache it is recommended that delivery be pursued regardless of gestation as significant morbidity is associated including stroke and seizure. ? ?However, I suggest that Ms. Dawn Vaughn may have  chronic hypertension and this is an exacerbation. She has never been treated with antihypertensive therapy. When I saw her for a consultation at 17 w 6 d she had blood pressures in the 140's/  90's.  ? ?She has not been started on medical therapy, which can cause headaches. ? ? ?We recommend: ?1) continue magnesium sulfate- for seizure prophylaxis until BMZ completed.  ?2) Continuous fetal monitoring ?3) Complete BMZ course ?4) NICU consultation ?5) Initiate oral Procardia with the goal of avg BP 135/85, continue to titrate until blood pressure is < 145/95 consistently. ?6) If her blood pressure is well managed her headache resolves she can be discharged with close follow up.  ?7) If her blood pressure is not responsive and her headache persist consider delivery. ?8) If discharged home initiate 2x weekly testing with plan for delivery at 37 weeks.  ?9) If her headache continues to be intermittent remain in house until 34 weeks. ?10) Continue blood sugar control per diabetes coordinator. ?11) Continue daily ASA and Lovenox. ? ?I discussed this history and recommendations with Ms. Breisch she agrees. I also discussed these recommendations with Dr. Mora Appl as well. ?  ?All questions answered. ?  ?Novella Olive, MD. ?  ?I spent 60 minutes with > 50% in direct telephonic consultation, review of her chart and care coordination. ?   ?  ?  ?  ? ?

## 2022-03-24 NOTE — Progress Notes (Signed)
MD Antepartum Note - late entry ?Name: Dawn Vaughn ?Medical Record Number:  [id number] ?Date of Birth: 1981-04-23 ?Date of Service: 03/24/2022 ? ?41 y.o. G2P0100 [redacted]w[redacted]d HD#1 admitted for new diagnosis of chronic hypertension with superimposed preeclampsia.  ?Patient notes a throbbing headache 4-5/10 currently. She denies any scotomata or blurry vision.  ?She denies contractions, no vaginal bleeding, no leaking of fluid. Reports good FM. ? ?The patient's past medical history and prenatal records were reviewed.  Additional issues addressed and updated today: ?Patient Active Problem List  ? Diagnosis Date Noted  ? Chronic hypertension with superimposed pre-eclampsia 04-08-22  ? Gestational diabetes mellitus, class A2 04-08-2022  ? Maternal obesity syndrome, antepartum, third trimester 04/08/2022  ? Antiphospholipid syndrome complicating pregnancy, antepartum ([place]) 04-08-22  ? Cervical cerclage suture present in third trimester 2022-04-08  ? AMA (advanced maternal age) multigravida 35+ 04/08/2022  ? H/O intrauterine fetal death, currently pregnant Apr 08, 2022  ? Sickle cell trait ([place]) 12/15/2021  ? ? ?Physical Examination:  ? ?Vitals:  ? Apr 08, 2022 2108 2022-04-08 2200 Apr 08, 2022 2300 03/24/22 0000  ?BP: (!) 154/71 (!) 159/87 (!) 165/88 115/69  ? 03/24/22 0100 03/24/22 0200 03/24/22 0300 03/24/22 0400  ?BP: (!) 129/98 (!) 144/87 (!) 143/78 140/79  ? 03/24/22 0500 03/24/22 0806 03/24/22 1109 03/24/22 1524  ?BP: (!) 144/87 (!) 151/82 126/63 139/69  ?  ? ?General appearance - alert, well appearing, and in no distress and oriented to person, place, and time ?Mental status - alert, oriented to person, place, and time, normal mood, behavior, speech, dress, motor activity, and thought processes ?Abdomen - Gravid, soft, non-tender ?Pelvic - examination not indicated ?Neurological - DTR's normal and symmetric ?Extremities - no pedal edema noted, SCDs in place ?FHTs  150s, moderate variability accels no decels ?[place]  no  ctx ? ? ?Results for orders placed or performed during the hospital encounter of 04/08/22 (from the past 24 hour(s))  ?Type and screen [place]     Status: None  ? Collection Time: 04/08/22  5:33 PM  ?Result Value Ref Range  ? ABO/RH(D) O POS   ? Antibody Screen NEG   ? Sample Expiration    ?  03/26/2022,2359 ?Performed at [facility], [place] [address], [place], [place] [address] ?  ?CBC     Status: Abnormal  ? Collection Time: 04-08-22  5:33 PM  ?Result Value Ref Range  ? WBC 9.4 4.0 - 10.5 K/uL  ? RBC 4.76 3.87 - 5.11 MIL/uL  ? Hemoglobin 11.7 (L) 12.0 - 15.0 g/dL  ? HCT 36.3 36.0 - 46.0 %  ? MCV 76.3 (L) 80.0 - 100.0 fL  ? MCH 24.6 (L) 26.0 - 34.0 pg  ? MCHC 32.2 30.0 - 36.0 g/dL  ? RDW 15.4 11.5 - 15.5 %  ? Platelets 184 150 - 400 K/uL  ? nRBC 0.0 0.0 - 0.2 %  ?Creatinine, serum     Status: None  ? Collection Time: 2022/04/08  5:33 PM  ?Result Value Ref Range  ? Creatinine, Ser 0.78 0.44 - 1.00 mg/dL  ? GFR, Estimated >60 >60 mL/min  ?Culture, beta strep (group b only)     Status: None (Preliminary result)  ? Collection Time: 04-08-2022  6:00 PM  ? Specimen: Vaginal/Rectal; Genital  ?Result Value Ref Range  ? Specimen Description VAGINAL/RECTAL   ? Special Requests NONE   ? Culture    ?  CULTURE REINCUBATED FOR BETTER GROWTH ?Performed at [facility], [place],  [place] [address] ?  ? Report Status PENDING   ?Glucose, capillary     Status: Abnormal  ? Collection Time: 03/23/22  9:24 PM  ?Result Value Ref Range  ? Glucose-Capillary 138 (H) 70 - 99 mg/dL  ?Glucose, capillary     Status: Abnormal  ? Collection Time: 03/24/22  5:49 AM  ?Result Value Ref Range  ? Glucose-Capillary 135 (H) 70 - 99 mg/dL  ?CBC     Status: Abnormal  ? Collection Time: 03/24/22  8:21 AM  ?Result Value Ref Range  ? WBC 13.3 (H) 4.0 - 10.5 K/uL  ? RBC 4.97 3.87 - 5.11 MIL/uL  ? Hemoglobin 12.2 12.0 - 15.0 g/dL  ? HCT 37.7 36.0 - 46.0 %  ? MCV 75.9 (L) 80.0 - 100.0 fL  ? MCH 24.5 (L) 26.0 - 34.0 pg   ? MCHC 32.4 30.0 - 36.0 g/dL  ? RDW 15.6 (H) 11.5 - 15.5 %  ? Platelets 192 150 - 400 K/uL  ? nRBC 0.0 0.0 - 0.2 %  ?Comprehensive metabolic panel     Status: Abnormal  ? Collection Time: 03/24/22  8:21 AM  ?Result Value Ref Range  ? Sodium 135 135 - 145 mmol/L  ? Potassium 4.2 3.5 - 5.1 mmol/L  ? Chloride 107 98 - 111 mmol/L  ? CO2 19 (L) 22 - 32 mmol/L  ? Glucose, Bld 124 (H) 70 - 99 mg/dL  ? BUN 6 6 - 20 mg/dL  ? Creatinine, Ser 0.65 0.44 - 1.00 mg/dL  ? Calcium 7.8 (L) 8.9 - 10.3 mg/dL  ? Total Protein 6.4 (L) 6.5 - 8.1 g/dL  ? Albumin 2.6 (L) 3.5 - 5.0 g/dL  ? AST 34 15 - 41 U/L  ? ALT 20 0 - 44 U/L  ? Alkaline Phosphatase 91 38 - 126 U/L  ? Total Bilirubin 0.5 0.3 - 1.2 mg/dL  ? GFR, Estimated >60 >60 mL/min  ? Anion gap 9 5 - 15  ?Glucose, capillary     Status: Abnormal  ? Collection Time: 03/24/22 11:07 AM  ?Result Value Ref Range  ? Glucose-Capillary 109 (H) 70 - 99 mg/dL  ?Glucose, capillary     Status: Abnormal  ? Collection Time: 03/24/22  3:21 PM  ?Result Value Ref Range  ? Glucose-Capillary 118 (H) 70 - 99 mg/dL  ? ? ?Assessment/Plan: ?41 year old G2P0 at 2 weeks 1 day HD#1  high risk pregnancy with multiple co-morbidities,  ?Appreciate MFM consultation by Dr. Grace Bushy. ? ?Chronic HTN with superimposed preeclampsia ? - Patient with labile blood pressures no severe range BPs.  ? - Will continue to closely monitor, once magnesium sulfate has been discontinued will add PO Procardia to ?    Her regimen ? - Per Dr. Grace Bushy, will continue IV magnesium sulfate for seizure prophylaxis 24 hours after her second dose ?              Of betamethasone (complete today 4/27 at 2100) for fetal lung maturity (Friday evening). ? -Delivery if maternal condition worsens, uncontrolled BPs ?            - Delivery at 34 weeks ? -Will order fioricet prn headache ?             ?A2GDM ? -Diabetic coordinator consulted, awaiting recommendations regarding medication management as fasting cbgs elevated.  ?            -Currently on  Metformin 500 mg BID.  Discussed with patient that we may have  to start a split dose insulin regimen to get tight ?             Glucose control. Steroids may be increasing fasting BG currently.  ? ?Antiphospholipid Ab / morbid obesity ? -SCDs while in bed ? -lovenox for VTE prophylaxis ? ?4. Fetal Well Being ? -Continuous monitoring has been category 1 - monitoring changed to NST q shift ? -MFM [place] for growth 3 weeks after last growth scan (approx May 4th) ?            -NICU consulted and made aware ? ?Sanjuana Kava, MD  ?03/24/2022 ?4:38 PM ? ?  ?

## 2022-03-24 NOTE — Progress Notes (Signed)
Inpatient Diabetes Program Recommendations ? ?Diabetes Treatment Program Recommendations ? ?ADA Standards of Care ?Diabetes in Pregnancy Target Glucose Ranges: ? ?Fasting: 70 - 95 mg/dL ?1 hr postprandial: Less than 140mg /dL (from first bite of meal) ?2 hr postprandial: Less than 120 mg/dL (from first bite of meal)   ? ?Lab Results  ?Component Value Date  ? GLUCAP 135 (H) 03/24/2022  ? ? ?Review of Glycemic Control ? Latest Reference Range & Units 03/23/22 21:24 03/24/22 05:49  ?Glucose-Capillary 70 - 99 mg/dL 03/26/22 (H) 425 (H)  ?(H): Data is abnormally high ?Diabetes history: GDM ?Outpatient Diabetes medications: none ?Current orders for Inpatient glycemic control: Metformin 500 mg BID ?BMZ x 1 ? ?Inpatient Diabetes Program Recommendations:   ? ?Consider adding Novolog 0-14 units TID & HS ? ?Thanks, ?956, MSN, RNC-OB ?Diabetes Coordinator ?[phone] (8a-5p) ? ? ? ? ?

## 2022-03-25 LAB — GLUCOSE, CAPILLARY
Glucose-Capillary: 138 mg/dL — ABNORMAL HIGH (ref 70–99)
Glucose-Capillary: 118 mg/dL — ABNORMAL HIGH (ref 70–99)
Glucose-Capillary: 130 mg/dL — ABNORMAL HIGH (ref 70–99)
Glucose-Capillary: 131 mg/dL — ABNORMAL HIGH (ref 70–99)

## 2022-03-25 LAB — CULTURE, OB URINE

## 2022-03-25 LAB — CULTURE, BETA STREP (GROUP B ONLY)

## 2022-03-25 MED ORDER — METFORMIN HCL 500 MG PO TABS
1000.0000 mg | ORAL_TABLET | Freq: Two times a day (BID) | ORAL | Status: DC
  Administered 2022-03-25 – 2022-03-27 (×4): 1000 mg via ORAL
  Filled 2022-03-25 (×4): qty 2

## 2022-03-25 NOTE — Progress Notes (Addendum)
S: Feeling good, denies HA, visual changes, and RUQ pain. Reports active FM, denies ctx, vaginal bleeding, and LOF.  ? ?O: ?Blood pressure 116/62, pulse 97, temperature 98.1 ?F (36.7 ?C), temperature source Oral, resp. rate 17, height 5\' 3"  (1.6 m), weight 113.4 kg, last menstrual period 08/01/2021, SpO2 97 %. ?CBG (last 3)  ?Recent Labs  ?  03/24/22 ?1521 03/24/22 ?2142 03/25/22 ?03/27/22  ?GLUCAP 118* 185* 138*  ? ? ?A/P: ?41 y.o.G2P0100 [redacted]w[redacted]d ? ?Stable ?CHTN with superimposed pre-eclampsia with severe features ?   -magnesium 2G/hr stop time 03/25/22 @ 1930 ?   -mild range BP ?A2DM ?   -elevated fasting CBG ?   -diabetic consult pending (day shift to call for consult) ?   -continue Metformin 500 mg BID ?Antiphospholipid syndrome ?Maternal obesity ?Hx IUFD @ 25 wks ?   -cerclage in place ?  ?MFM recommendations: ?1) continue magnesium sulfate- for seizure prophylaxis until BMZ completed.  ?2) Continuous fetal monitoring ?3) Complete BMZ course Dose # 2 03/24/22 @ 1811 ?4) NICU consultation ?5) Initiate oral Procardia with the goal of avg BP 135/85, continue to titrate until blood pressure is < 145/95 consistently. ?6) If her blood pressure is well managed her headache resolves she can be discharged with close follow up.  ?7) If her blood pressure is not responsive and her headache persist consider delivery. ?8) If discharged home initiate 2x weekly testing with plan for delivery at 37 weeks.  ?9) If her headache continues to be intermittent remain in house until 34 weeks. ?10) Continue blood sugar control per diabetes coordinator. ?11) Continue daily ASA and Lovenox. ? ?03/26/22, MSN, CNM ?03/25/2022 6:51 AM ? ?

## 2022-03-25 NOTE — Progress Notes (Addendum)
Metformin increased to 1,000mg  BID per diabetic coordinator, Lujean Rave recs. ?

## 2022-03-26 LAB — GLUCOSE, CAPILLARY
Glucose-Capillary: 95 mg/dL (ref 70–99)
Glucose-Capillary: 90 mg/dL (ref 70–99)
Glucose-Capillary: 105 mg/dL — ABNORMAL HIGH (ref 70–99)
Glucose-Capillary: 93 mg/dL (ref 70–99)

## 2022-03-26 NOTE — Progress Notes (Signed)
Antepartum Progress Note ? ?Subjective: Patient doing well. No concerns or questions today. Endorses good FM. Denies VB, LOF, or CTX. ? ?Denies fevers, chills, chest pain, visual changes, SOB, RUQ/epigastric pain, N/V, dysuria, hematuria, or sudden onset/worsening bilateral LE or facial edema. ? ?Objective: ?BP 114/64 (BP Location: Left Arm)   Pulse 80   Temp 97.9 ?F (36.6 ?C) (Oral)   Resp 18   Ht 5\' 3"  (1.6 m)   Wt 113.4 kg   LMP 08/01/2021   SpO2 99%   BMI 44.29 kg/m?  ?Gen:  NAD, pleasant and cooperative ?Cardio:  RRR ?Pulm:  CTAB, no wheezes/rales/rhonchi ?Abd:  Soft, gravid, non-distended, non-tender throughout, no rebound/guarding ?Ext:  No bilateral LE edema, no bilateral calf tenderness ?Neuro: 1+ bilateral brachioradialis DTRs ? ?FHT: 150-155bpm, moderate variability, + accel, - decelerations ?Toco: None ? ?Fasting BS: 95 ? ? ?Results for orders placed or performed during the hospital encounter of 03/23/22  ?Culture, beta strep (group b only)  ? Specimen: Vaginal/Rectal; Genital  ?Result Value Ref Range  ? Specimen Description VAGINAL/RECTAL   ? Special Requests NONE   ? Culture    ?  NO GROUP B STREP (S.AGALACTIAE) ISOLATED ?Performed at [facility], [address] [place], [place] ?  ? Report Status 03/25/2022 FINAL   ?Culture, OB Urine  ? Specimen: Urine, Random  ?Result Value Ref Range  ? Specimen Description URINE, RANDOM   ? Special Requests NONE   ? Culture (A)   ?  MULTIPLE SPECIES PRESENT, SUGGEST RECOLLECTION ?NO GROUP B STREP (S.AGALACTIAE) ISOLATED ?Performed at [facility], [address] [place], [place] ?  ? Report Status 03/25/2022 FINAL   ?Urinalysis, Routine w reflex microscopic Urine, Clean Catch  ?Result Value Ref Range  ? Color, Urine STRAW (A) YELLOW  ? APPearance CLEAR CLEAR  ? Specific Gravity, Urine 1.003 (L) 1.005 - 1.030  ? pH 6.0 5.0 - 8.0  ? Glucose, UA NEGATIVE NEGATIVE mg/dL  ? Hgb urine dipstick NEGATIVE NEGATIVE  ? Bilirubin Urine  NEGATIVE NEGATIVE  ? Ketones, ur NEGATIVE NEGATIVE mg/dL  ? Protein, ur NEGATIVE NEGATIVE mg/dL  ? Nitrite NEGATIVE NEGATIVE  ? Leukocytes,Ua MODERATE (A) NEGATIVE  ? RBC / HPF 0-5 0 - 5 RBC/hpf  ? WBC, UA 0-5 0 - 5 WBC/hpf  ? Bacteria, UA FEW (A) NONE SEEN  ? Squamous Epithelial / LPF 0-5 0 - 5  ?CBC  ?Result Value Ref Range  ? WBC 8.6 4.0 - 10.5 K/uL  ? RBC 4.89 3.87 - 5.11 MIL/uL  ? Hemoglobin 12.0 12.0 - 15.0 g/dL  ? HCT 37.3 36.0 - 46.0 %  ? MCV 76.3 (L) 80.0 - 100.0 fL  ? MCH 24.5 (L) 26.0 - 34.0 pg  ? MCHC 32.2 30.0 - 36.0 g/dL  ? RDW 15.4 11.5 - 15.5 %  ? Platelets 196 150 - 400 K/uL  ? nRBC 0.0 0.0 - 0.2 %  ?Comprehensive metabolic panel  ?Result Value Ref Range  ? Sodium 136 135 - 145 mmol/L  ? Potassium 4.2 3.5 - 5.1 mmol/L  ? Chloride 108 98 - 111 mmol/L  ? CO2 22 22 - 32 mmol/L  ? Glucose, Bld 95 70 - 99 mg/dL  ? BUN <5 (L) 6 - 20 mg/dL  ? Creatinine, Ser 0.65 0.44 - 1.00 mg/dL  ? Calcium 8.9 8.9 - 10.3 mg/dL  ? Total Protein 6.4 (L) 6.5 - 8.1 g/dL  ? Albumin 2.6 (L) 3.5 - 5.0  g/dL  ? AST 23 15 - 41 U/L  ? ALT 20 0 - 44 U/L  ? Alkaline Phosphatase 91 38 - 126 U/L  ? Total Bilirubin 0.4 0.3 - 1.2 mg/dL  ? GFR, Estimated >60 >60 mL/min  ? Anion gap 6 5 - 15  ?Protein / creatinine ratio, urine  ?Result Value Ref Range  ? Creatinine, Urine 26.45 mg/dL  ? Total Protein, Urine <6 mg/dL  ? Protein Creatinine Ratio        0.00 - 0.15 mg/mg[Cre]  ?CBC  ?Result Value Ref Range  ? WBC 9.4 4.0 - 10.5 K/uL  ? RBC 4.76 3.87 - 5.11 MIL/uL  ? Hemoglobin 11.7 (L) 12.0 - 15.0 g/dL  ? HCT 36.3 36.0 - 46.0 %  ? MCV 76.3 (L) 80.0 - 100.0 fL  ? MCH 24.6 (L) 26.0 - 34.0 pg  ? MCHC 32.2 30.0 - 36.0 g/dL  ? RDW 15.4 11.5 - 15.5 %  ? Platelets 184 150 - 400 K/uL  ? nRBC 0.0 0.0 - 0.2 %  ?Creatinine, serum  ?Result Value Ref Range  ? Creatinine, Ser 0.78 0.44 - 1.00 mg/dL  ? GFR, Estimated >60 >60 mL/min  ?Glucose, capillary  ?Result Value Ref Range  ? Glucose-Capillary 138 (H) 70 - 99 mg/dL  ?Glucose, capillary  ?Result Value  Ref Range  ? Glucose-Capillary 135 (H) 70 - 99 mg/dL  ?CBC  ?Result Value Ref Range  ? WBC 13.3 (H) 4.0 - 10.5 K/uL  ? RBC 4.97 3.87 - 5.11 MIL/uL  ? Hemoglobin 12.2 12.0 - 15.0 g/dL  ? HCT 37.7 36.0 - 46.0 %  ? MCV 75.9 (L) 80.0 - 100.0 fL  ? MCH 24.5 (L) 26.0 - 34.0 pg  ? MCHC 32.4 30.0 - 36.0 g/dL  ? RDW 15.6 (H) 11.5 - 15.5 %  ? Platelets 192 150 - 400 K/uL  ? nRBC 0.0 0.0 - 0.2 %  ?Comprehensive metabolic panel  ?Result Value Ref Range  ? Sodium 135 135 - 145 mmol/L  ? Potassium 4.2 3.5 - 5.1 mmol/L  ? Chloride 107 98 - 111 mmol/L  ? CO2 19 (L) 22 - 32 mmol/L  ? Glucose, Bld 124 (H) 70 - 99 mg/dL  ? BUN 6 6 - 20 mg/dL  ? Creatinine, Ser 0.65 0.44 - 1.00 mg/dL  ? Calcium 7.8 (L) 8.9 - 10.3 mg/dL  ? Total Protein 6.4 (L) 6.5 - 8.1 g/dL  ? Albumin 2.6 (L) 3.5 - 5.0 g/dL  ? AST 34 15 - 41 U/L  ? ALT 20 0 - 44 U/L  ? Alkaline Phosphatase 91 38 - 126 U/L  ? Total Bilirubin 0.5 0.3 - 1.2 mg/dL  ? GFR, Estimated >60 >60 mL/min  ? Anion gap 9 5 - 15  ?Glucose, capillary  ?Result Value Ref Range  ? Glucose-Capillary 109 (H) 70 - 99 mg/dL  ?Glucose, capillary  ?Result Value Ref Range  ? Glucose-Capillary 118 (H) 70 - 99 mg/dL  ?Glucose, capillary  ?Result Value Ref Range  ? Glucose-Capillary 185 (H) 70 - 99 mg/dL  ?Glucose, capillary  ?Result Value Ref Range  ? Glucose-Capillary 138 (H) 70 - 99 mg/dL  ?Glucose, capillary  ?Result Value Ref Range  ? Glucose-Capillary 118 (H) 70 - 99 mg/dL  ?Glucose, capillary  ?Result Value Ref Range  ? Glucose-Capillary 130 (H) 70 - 99 mg/dL  ?Glucose, capillary  ?Result Value Ref Range  ? Glucose-Capillary 131 (H) 70 - 99 mg/dL  ?Glucose, capillary  ?Result  Value Ref Range  ? Glucose-Capillary 95 70 - 99 mg/dL  ?Type and screen [facility]  ?Result Value Ref Range  ? ABO/RH(D) O POS   ? Antibody Screen NEG   ? Sample Expiration    ?  03/26/2022,2359 ?Performed at [facility], [address] [place], [place] [address] ?  ? ? ? ?A/P: ?Gerrie NordmannLastasia N Vaughn is a 41  y.o. G2P0100 @ 5365w3d admitted for chronic HTN with superimposed preeclampsia with severe features (due to BP and HA). ? ?Chronic HTN with exacerbation vs superimposed preeclampsia with SF ?- BP range 110s-148/53-80, most recent 114/64 ?- Medication: None ?- S/p Magnesium sulfate for seizure prophylaxis 4/26-4/28 ?- FLM: Betamethasone complete 4/28 ?- FWB: Reactive FHT ?- GBS: Negative ?- NSTs qshift ?- NICU consult placed ?- S/p MFM Consult (see note) ?- Continue inpatient management at this time ? ?Class A2DM ?- Medication: Metformin 1000mg  BID (increased on 4/28) ?- CBG fasting and 2h postprandial ?- BS range: 110s-130, fasting this AM 95 ?- S/p diabetic consult ?- May need SSI ? ?Cervical insufficiency ?- Cerclage placed at 14 weeks (11/19/21) ?- [place] cerclage with knot tied anteriorly with 1 Prolene at base ? ?Antiphospholipid syndrome ?- Medication: Lovenox 60mg  daily and ASA 81mg  daily ? ?Hx IUFD at 25 weeks ? ?Obesity (BMI 44.5) ?- Recommend frequent ambulation ?- SCDs ordered, prophylactic Lovenox ordered ? ?MFM Recommendations: ?1) continue magnesium sulfate -- completed on 4/28 ?2) Continuous fetal monitoring -- transitioned to qshift NSTs ?3) Complete BMZ course -- complete on 4/28 ?4) NICU consultation -- ordered ?5) Initiate oral Procardia with the goal of avg BP 135/85, continue to titrate until blood pressure is < 145/95 consistently -- not initiated due to most Bps less than 140s/90s ?6) If her blood pressure is well managed her headache resolves she can be discharged with close follow up.  ?7) If her blood pressure is not responsive and her headache persist consider delivery. ?8) If discharged home initiate 2x weekly testing with plan for delivery at 37 weeks.  ?9) If her headache continues to be intermittent remain in house until 34 weeks. ?10) Continue blood sugar control per diabetes coordinator. ?11) Continue daily ASA and Lovenox. ? ? ?Steva ReadyMelissa Catelynn Sparger, DO ? ? ?

## 2022-03-27 LAB — GLUCOSE, CAPILLARY
Glucose-Capillary: 95 mg/dL (ref 70–99)
Glucose-Capillary: 81 mg/dL (ref 70–99)
Glucose-Capillary: 109 mg/dL — ABNORMAL HIGH (ref 70–99)

## 2022-03-27 LAB — COMPREHENSIVE METABOLIC PANEL
ALT: 25 U/L (ref 0–44)
AST: 28 U/L (ref 15–41)
Albumin: 2.6 g/dL — ABNORMAL LOW (ref 3.5–5.0)
Alkaline Phosphatase: 79 U/L (ref 38–126)
Anion gap: 9 (ref 5–15)
BUN: 10 mg/dL (ref 6–20)
CO2: 21 mmol/L — ABNORMAL LOW (ref 22–32)
Calcium: 8.8 mg/dL — ABNORMAL LOW (ref 8.9–10.3)
Chloride: 105 mmol/L (ref 98–111)
Creatinine, Ser: 0.61 mg/dL (ref 0.44–1.00)
GFR, Estimated: >60 mL/min (ref >60)
Glucose, Bld: 81 mg/dL (ref 70–99)
Potassium: 3.9 mmol/L (ref 3.5–5.1)
Sodium: 135 mmol/L (ref 135–145)
Total Bilirubin: 0.2 mg/dL — ABNORMAL LOW (ref 0.3–1.2)
Total Protein: 6.2 g/dL — ABNORMAL LOW (ref 6.5–8.1)

## 2022-03-27 LAB — URIC ACID: Uric Acid, Serum: 5.4 mg/dL (ref 2.5–7.1)

## 2022-03-27 LAB — CBC
HCT: 35.1 % — ABNORMAL LOW (ref 36.0–46.0)
Hemoglobin: 11.6 g/dL — ABNORMAL LOW (ref 12.0–15.0)
MCH: 25.1 pg — ABNORMAL LOW (ref 26.0–34.0)
MCHC: 33 g/dL (ref 30.0–36.0)
MCV: 75.8 fL — ABNORMAL LOW (ref 80.0–100.0)
Platelets: 174 10*3/uL (ref 150–400)
RBC: 4.63 MIL/uL (ref 3.87–5.11)
RDW: 15.6 % — ABNORMAL HIGH (ref 11.5–15.5)
WBC: 10.7 10*3/uL — ABNORMAL HIGH (ref 4.0–10.5)
nRBC: 0 % (ref 0.0–0.2)

## 2022-03-27 LAB — PROTEIN / CREATININE RATIO, URINE
Creatinine, Urine: 64.8 mg/dL
Total Protein, Urine: <6 mg/dL

## 2022-03-27 LAB — LACTATE DEHYDROGENASE: LDH: 149 U/L (ref 98–192)

## 2022-03-27 MED ORDER — ENOXAPARIN SODIUM 60 MG/0.6ML IJ SOSY: 60.0000 mg | PREFILLED_SYRINGE | INTRAMUSCULAR | 1 refills | Status: DC

## 2022-03-27 MED ORDER — METFORMIN HCL 1000 MG PO TABS: 1000.0000 mg | ORAL_TABLET | Freq: Two times a day (BID) | ORAL | 3 refills | Status: DC

## 2022-03-27 MED ORDER — VALACYCLOVIR HCL 500 MG PO TABS: 500.0000 mg | ORAL_TABLET | Freq: Every day | ORAL | 11 refills | Status: DC

## 2022-03-27 NOTE — Discharge Summary (Addendum)
Physician Discharge Summary  ?Patient ID: ?Dawn Vaughn ?MRN: [id number] ?DOB/AGE: 05/17/1981 41 y.o. ? ?Admit date: 03/23/2022 ?Discharge date: 03/27/2022 ? ?Admission Diagnoses: ?Preeclampsia vs Exacerbation of CHTN ? ?Discharge Diagnoses:  ?Principal Problem: ?  Chronic hypertension with superimposed pre-eclampsia ?Active Problems: ?  Gestational diabetes mellitus, class A2 ?  Maternal obesity syndrome, antepartum, third trimester ?  Antiphospholipid syndrome complicating pregnancy, antepartum ([place]) ?  Cervical cerclage suture present in third trimester ?  AMA (advanced maternal age) multigravida 9+ ?  H/O intrauterine fetal death, currently pregnant ? ? ?Discharged Condition: stable ? ?Hospital Course: Doing well.  Has not had a HA for >72hrs now.  S/p BMZ x 2.  S/p Mg while getting BMZ.  BPs stable off MG and none in the severe range and pt is not on BP medication.  She is T2DM on 1000 mg metformin BID.  She has APS and h/o IUFD at 25wks so is on 81mg  aspiring and lovenox 60mg  [place] daily and pt will clarify lovenox with Dr. Gertie Exon when she sees him on Tuesday 03-29-22 d/t a note he placed back in January.  Pt being sent home with rx for valacyclovir to start for suppressive tx 500mg  daily.  Dr. Shon Baton note says deliver at 37wks but if HA intermittent or persistent 34wks or if BPs in severe range.  Pt will go home on bedrest.  Cerclage is in place and will be removed prior to IOL. ? ?Consults:  MFM ? ?Significant Diagnostic Studies: normal GHTN labs and neg urine PCR. ? ?Treatments: steroids: BMZ series ? ?Discharge Exam: ?Blood pressure 140/73, pulse 89, temperature 98.3 ?F (36.8 ?C), temperature source Oral, resp. rate 17, height 5\' 3"  (1.6 m), weight 113.4 kg, last menstrual period 08/01/2021, SpO2 98 %. ?General appearance: alert and no distress ?Resp: clear to auscultation bilaterally ?Cardio: regular rate and rhythm ?GI: soft, gravid, NT ?Extremities: no calf tenderness ? ?Disposition: stable ?There are  no questions and answers to display.  ? ? ? ? ? ? ? ?Allergies as of 03/27/2022   ?No Known Allergies ?  ? ?  ?Medication List  ?  ? ?STOP taking these medications   ? ?IRON PO ?  ? ?  ? ?TAKE these medications   ? ?aspirin EC 81 MG tablet ?Take 81 mg by mouth daily. Swallow whole. ?  ?enoxaparin 60 MG/0.6ML injection ?Commonly known as: LOVENOX ?Inject 0.6 mLs (60 mg total) into the skin daily. ?  ?metFORMIN 1000 MG tablet ?Commonly known as: GLUCOPHAGE ?Take 1 tablet (1,000 mg total) by mouth 2 (two) times daily with a meal. ?  ?PRENATAL VITAMIN PO ?Take 1 tablet by mouth daily with lunch. ?  ?valACYclovir 500 MG tablet ?Commonly known as: Valtrex ?Take 1 tablet (500 mg total) by mouth daily. ?  ? ?  ? ? Follow-up Information   ? ? [facility] Follow up on 04/01/2022.   ?Specialty: Obstetrics and Gynecology ?Why: Call and schedule lab visit for Tues afternoon and then appts Tuesdays and Fridays for uls, NST and visit. ?Contact information: ?[address] ?[address] ?[place] [phone] ?[phone] ? ?  ?  ? ?  ?  ? ?  ? ? ?Signed: ?Delice Lesch ?03/27/2022, 1:48 PM ? ? ?

## 2022-03-27 NOTE — Plan of Care (Signed)
?  Problem: Education: ?Goal: Knowledge of General Education information will improve ?Description: Including pain rating scale, medication(s)/side effects and non-pharmacologic comfort measures ?Outcome: Completed/Met ?  ?Problem: Health Behavior/Discharge Planning: ?Goal: Ability to manage health-related needs will improve ?Outcome: Completed/Met ?  ?Problem: Clinical Measurements: ?Goal: Ability to maintain clinical measurements within normal limits will improve ?Outcome: Completed/Met ?Goal: Will remain free from infection ?Outcome: Completed/Met ?Goal: Diagnostic test results will improve ?Outcome: Completed/Met ?Goal: Respiratory complications will improve ?Outcome: Completed/Met ?Goal: Cardiovascular complication will be avoided ?Outcome: Completed/Met ?  ?Problem: Activity: ?Goal: Risk for activity intolerance will decrease ?Outcome: Completed/Met ?  ?Problem: Nutrition: ?Goal: Adequate nutrition will be maintained ?Outcome: Completed/Met ?  ?Problem: Coping: ?Goal: Level of anxiety will decrease ?Outcome: Completed/Met ?  ?Problem: Elimination: ?Goal: Will not experience complications related to bowel motility ?Outcome: Completed/Met ?Goal: Will not experience complications related to urinary retention ?Outcome: Completed/Met ?  ?Problem: Pain Managment: ?Goal: General experience of comfort will improve ?Outcome: Completed/Met ?  ?Problem: Safety: ?Goal: Ability to remain free from injury will improve ?Outcome: Completed/Met ?  ?Problem: Skin Integrity: ?Goal: Risk for impaired skin integrity will decrease ?Outcome: Completed/Met ?  ?Problem: Education: ?Goal: Knowledge of disease or condition will improve ?Outcome: Completed/Met ?Goal: Knowledge of the prescribed therapeutic regimen will improve ?Outcome: Completed/Met ?Goal: Individualized Educational Video(s) ?Outcome: Completed/Met ?  ?Problem: Clinical Measurements: ?Goal: Complications related to the disease process, condition or treatment will be  avoided or minimized ?Outcome: Completed/Met ?  ?Problem: Education: ?Goal: Knowledge of the prescribed therapeutic regimen will improve ?Outcome: Completed/Met ?  ?Problem: Fluid Volume: ?Goal: Peripheral tissue perfusion will improve ?Outcome: Completed/Met ?  ?Problem: Clinical Measurements: ?Goal: Complications related to disease process, condition or treatment will be avoided or minimized ?Outcome: Completed/Met ?  ?

## 2022-03-29 ENCOUNTER — Ambulatory Visit: Payer: BC Managed Care – PPO | Admitting: *Deleted

## 2022-03-29 ENCOUNTER — Ambulatory Visit: Payer: BC Managed Care – PPO | Attending: Maternal & Fetal Medicine

## 2022-03-29 ENCOUNTER — Other Ambulatory Visit: Payer: Self-pay | Admitting: *Deleted

## 2022-03-29 VITALS — BP 133/86 | HR 114

## 2022-03-29 DIAGNOSIS — O133 Gestational [pregnancy-induced] hypertension without significant proteinuria, third trimester: Secondary | ICD-10-CM | POA: Diagnosis not present

## 2022-03-29 DIAGNOSIS — Z362 Encounter for other antenatal screening follow-up: Secondary | ICD-10-CM

## 2022-03-29 DIAGNOSIS — D573 Sickle-cell trait: Secondary | ICD-10-CM | POA: Diagnosis present

## 2022-03-29 DIAGNOSIS — O09513 Supervision of elderly primigravida, third trimester: Secondary | ICD-10-CM | POA: Insufficient documentation

## 2022-03-29 DIAGNOSIS — O3433 Maternal care for cervical incompetence, third trimester: Secondary | ICD-10-CM

## 2022-03-29 DIAGNOSIS — O24415 Gestational diabetes mellitus in pregnancy, controlled by oral hypoglycemic drugs: Secondary | ICD-10-CM

## 2022-03-29 DIAGNOSIS — O99213 Obesity complicating pregnancy, third trimester: Secondary | ICD-10-CM | POA: Diagnosis not present

## 2022-03-29 DIAGNOSIS — D6861 Antiphospholipid syndrome: Secondary | ICD-10-CM | POA: Diagnosis present

## 2022-03-29 DIAGNOSIS — O09523 Supervision of elderly multigravida, third trimester: Secondary | ICD-10-CM

## 2022-03-29 DIAGNOSIS — O09299 Supervision of pregnancy with other poor reproductive or obstetric history, unspecified trimester: Secondary | ICD-10-CM | POA: Diagnosis present

## 2022-03-29 DIAGNOSIS — O24419 Gestational diabetes mellitus in pregnancy, unspecified control: Secondary | ICD-10-CM

## 2022-03-29 DIAGNOSIS — O99119 Other diseases of the blood and blood-forming organs and certain disorders involving the immune mechanism complicating pregnancy, unspecified trimester: Secondary | ICD-10-CM | POA: Diagnosis present

## 2022-03-29 DIAGNOSIS — O99019 Anemia complicating pregnancy, unspecified trimester: Secondary | ICD-10-CM | POA: Diagnosis present

## 2022-03-29 DIAGNOSIS — O09293 Supervision of pregnancy with other poor reproductive or obstetric history, third trimester: Secondary | ICD-10-CM

## 2022-03-29 DIAGNOSIS — E669 Obesity, unspecified: Secondary | ICD-10-CM

## 2022-03-29 DIAGNOSIS — Z3A32 32 weeks gestation of pregnancy: Secondary | ICD-10-CM

## 2022-04-05 ENCOUNTER — Ambulatory Visit: Payer: BC Managed Care – PPO | Attending: Maternal & Fetal Medicine

## 2022-04-05 ENCOUNTER — Ambulatory Visit: Payer: BC Managed Care – PPO | Admitting: *Deleted

## 2022-04-05 VITALS — BP 134/82 | HR 104

## 2022-04-05 DIAGNOSIS — O99119 Other diseases of the blood and blood-forming organs and certain disorders involving the immune mechanism complicating pregnancy, unspecified trimester: Secondary | ICD-10-CM | POA: Insufficient documentation

## 2022-04-05 DIAGNOSIS — O24415 Gestational diabetes mellitus in pregnancy, controlled by oral hypoglycemic drugs: Secondary | ICD-10-CM

## 2022-04-05 DIAGNOSIS — O99213 Obesity complicating pregnancy, third trimester: Secondary | ICD-10-CM | POA: Insufficient documentation

## 2022-04-05 DIAGNOSIS — O3433 Maternal care for cervical incompetence, third trimester: Secondary | ICD-10-CM | POA: Diagnosis present

## 2022-04-05 DIAGNOSIS — E669 Obesity, unspecified: Secondary | ICD-10-CM

## 2022-04-05 DIAGNOSIS — Z3A33 33 weeks gestation of pregnancy: Secondary | ICD-10-CM

## 2022-04-05 DIAGNOSIS — O133 Gestational [pregnancy-induced] hypertension without significant proteinuria, third trimester: Secondary | ICD-10-CM | POA: Diagnosis present

## 2022-04-05 DIAGNOSIS — D573 Sickle-cell trait: Secondary | ICD-10-CM

## 2022-04-05 DIAGNOSIS — O09523 Supervision of elderly multigravida, third trimester: Secondary | ICD-10-CM | POA: Diagnosis not present

## 2022-04-05 DIAGNOSIS — D6861 Antiphospholipid syndrome: Secondary | ICD-10-CM | POA: Diagnosis present

## 2022-04-05 DIAGNOSIS — O09299 Supervision of pregnancy with other poor reproductive or obstetric history, unspecified trimester: Secondary | ICD-10-CM | POA: Insufficient documentation

## 2022-04-05 DIAGNOSIS — O24419 Gestational diabetes mellitus in pregnancy, unspecified control: Secondary | ICD-10-CM | POA: Insufficient documentation

## 2022-04-05 DIAGNOSIS — O09293 Supervision of pregnancy with other poor reproductive or obstetric history, third trimester: Secondary | ICD-10-CM

## 2022-04-05 DIAGNOSIS — O285 Abnormal chromosomal and genetic finding on antenatal screening of mother: Secondary | ICD-10-CM

## 2022-04-11 ENCOUNTER — Other Ambulatory Visit: Payer: Self-pay | Admitting: Obstetrics & Gynecology

## 2022-04-12 ENCOUNTER — Ambulatory Visit: Payer: BC Managed Care – PPO | Admitting: *Deleted

## 2022-04-12 ENCOUNTER — Other Ambulatory Visit: Payer: Self-pay | Admitting: Maternal & Fetal Medicine

## 2022-04-12 ENCOUNTER — Ambulatory Visit: Payer: BC Managed Care – PPO | Attending: Maternal & Fetal Medicine

## 2022-04-12 VITALS — BP 132/86 | HR 115

## 2022-04-12 DIAGNOSIS — O09299 Supervision of pregnancy with other poor reproductive or obstetric history, unspecified trimester: Secondary | ICD-10-CM | POA: Diagnosis present

## 2022-04-12 DIAGNOSIS — O09523 Supervision of elderly multigravida, third trimester: Secondary | ICD-10-CM

## 2022-04-12 DIAGNOSIS — O368190 Decreased fetal movements, unspecified trimester, not applicable or unspecified: Secondary | ICD-10-CM

## 2022-04-12 DIAGNOSIS — Z3A34 34 weeks gestation of pregnancy: Secondary | ICD-10-CM

## 2022-04-12 DIAGNOSIS — O133 Gestational [pregnancy-induced] hypertension without significant proteinuria, third trimester: Secondary | ICD-10-CM

## 2022-04-12 DIAGNOSIS — O99119 Other diseases of the blood and blood-forming organs and certain disorders involving the immune mechanism complicating pregnancy, unspecified trimester: Secondary | ICD-10-CM

## 2022-04-12 DIAGNOSIS — O09293 Supervision of pregnancy with other poor reproductive or obstetric history, third trimester: Secondary | ICD-10-CM

## 2022-04-12 DIAGNOSIS — O24415 Gestational diabetes mellitus in pregnancy, controlled by oral hypoglycemic drugs: Secondary | ICD-10-CM | POA: Diagnosis not present

## 2022-04-12 DIAGNOSIS — D6861 Antiphospholipid syndrome: Secondary | ICD-10-CM | POA: Insufficient documentation

## 2022-04-12 DIAGNOSIS — O368390 Maternal care for abnormalities of the fetal heart rate or rhythm, unspecified trimester, not applicable or unspecified: Secondary | ICD-10-CM

## 2022-04-12 DIAGNOSIS — D573 Sickle-cell trait: Secondary | ICD-10-CM

## 2022-04-12 DIAGNOSIS — O99213 Obesity complicating pregnancy, third trimester: Secondary | ICD-10-CM

## 2022-04-12 DIAGNOSIS — E669 Obesity, unspecified: Secondary | ICD-10-CM

## 2022-04-12 DIAGNOSIS — O24419 Gestational diabetes mellitus in pregnancy, unspecified control: Secondary | ICD-10-CM | POA: Diagnosis present

## 2022-04-12 DIAGNOSIS — O3433 Maternal care for cervical incompetence, third trimester: Secondary | ICD-10-CM | POA: Diagnosis present

## 2022-04-12 DIAGNOSIS — O99013 Anemia complicating pregnancy, third trimester: Secondary | ICD-10-CM

## 2022-04-12 NOTE — Procedures (Signed)
Dawn Vaughn ?Oct 14, 1981 ?[redacted]w[redacted]d ? ?Fetus A Non-Stress Test Interpretation for 04/12/22 ? ?Indication: Gestational Diabetes medication controlled, Decreased Fetal Movement, and AMA, obesity ? ?Fetal Heart Rate A ?Mode: External ?Baseline Rate (A): 170 bpm ?Variability: Moderate ?Accelerations: 15 x 15 ?Decelerations: None ?Multiple birth?: No ? ?Uterine Activity ?Mode: Palpation, Toco ?Contraction Frequency (min): UI ?Contraction Quality: Mild ?Resting Tone Palpated: Relaxed ?Resting Time: Adequate ? ?Interpretation (Fetal Testing) ?Comments: Baseline not well defined. Accels are present above the baseline, but FHR remains tachycardic. Re-check of patient's pulse-100 BPM. Patient has appt at [facility] @ 4:45. Dr. Judeth Cornfield reviewed tracing and has communicated with Manfred Arch, CNM ? ? ?

## 2022-04-13 ENCOUNTER — Encounter (HOSPITAL_COMMUNITY): Payer: Self-pay

## 2022-04-13 ENCOUNTER — Telehealth (HOSPITAL_COMMUNITY): Payer: Self-pay | Admitting: *Deleted

## 2022-04-13 NOTE — Telephone Encounter (Signed)
Preadmission screen  

## 2022-04-14 ENCOUNTER — Encounter (HOSPITAL_COMMUNITY): Payer: Self-pay | Admitting: *Deleted

## 2022-04-14 ENCOUNTER — Telehealth (HOSPITAL_COMMUNITY): Payer: Self-pay | Admitting: *Deleted

## 2022-04-14 NOTE — Telephone Encounter (Signed)
Hello Dawn Vaughn   I am trying to reach you to review your Medical History and answer any questions you have regarding your Induction of Labor.  My number is [phone].  I am in the office from 8:30-4:30 Monday thru Friday.    Hardie Pulley MSN [facility] Preadmission Nurse

## 2022-04-16 LAB — OB RESULTS CONSOLE GBS: GBS: NEGATIVE

## 2022-04-18 ENCOUNTER — Encounter (HOSPITAL_COMMUNITY): Payer: Self-pay | Admitting: Obstetrics & Gynecology

## 2022-04-18 ENCOUNTER — Inpatient Hospital Stay (HOSPITAL_COMMUNITY)
Admission: AD | Admit: 2022-04-18 | Discharge: 2022-04-18 | Disposition: A | Discharge status: Home / Self Care | Payer: BC Managed Care – PPO | Attending: Obstetrics & Gynecology | Admitting: Obstetrics & Gynecology

## 2022-04-18 ENCOUNTER — Telehealth: Payer: Self-pay

## 2022-04-18 DIAGNOSIS — Z3A35 35 weeks gestation of pregnancy: Secondary | ICD-10-CM | POA: Insufficient documentation

## 2022-04-18 DIAGNOSIS — D573 Sickle-cell trait: Secondary | ICD-10-CM | POA: Insufficient documentation

## 2022-04-18 DIAGNOSIS — O99013 Anemia complicating pregnancy, third trimester: Secondary | ICD-10-CM | POA: Insufficient documentation

## 2022-04-18 DIAGNOSIS — O99113 Other diseases of the blood and blood-forming organs and certain disorders involving the immune mechanism complicating pregnancy, third trimester: Secondary | ICD-10-CM | POA: Diagnosis not present

## 2022-04-18 DIAGNOSIS — D6861 Antiphospholipid syndrome: Secondary | ICD-10-CM | POA: Diagnosis not present

## 2022-04-18 DIAGNOSIS — O09523 Supervision of elderly multigravida, third trimester: Secondary | ICD-10-CM | POA: Diagnosis not present

## 2022-04-18 DIAGNOSIS — O10913 Unspecified pre-existing hypertension complicating pregnancy, third trimester: Secondary | ICD-10-CM | POA: Diagnosis not present

## 2022-04-18 DIAGNOSIS — O26893 Other specified pregnancy related conditions, third trimester: Secondary | ICD-10-CM | POA: Diagnosis not present

## 2022-04-18 DIAGNOSIS — O3433 Maternal care for cervical incompetence, third trimester: Secondary | ICD-10-CM

## 2022-04-18 DIAGNOSIS — O119 Pre-existing hypertension with pre-eclampsia, unspecified trimester: Secondary | ICD-10-CM | POA: Diagnosis not present

## 2022-04-18 DIAGNOSIS — O99213 Obesity complicating pregnancy, third trimester: Secondary | ICD-10-CM | POA: Diagnosis not present

## 2022-04-18 DIAGNOSIS — O09299 Supervision of pregnancy with other poor reproductive or obstetric history, unspecified trimester: Secondary | ICD-10-CM

## 2022-04-18 LAB — COMPREHENSIVE METABOLIC PANEL
ALT: 19 U/L (ref 0–44)
AST: 18 U/L (ref 15–41)
Albumin: 2.4 g/dL — ABNORMAL LOW (ref 3.5–5.0)
Alkaline Phosphatase: 115 U/L (ref 38–126)
Anion gap: 8 (ref 5–15)
BUN: 9 mg/dL (ref 6–20)
CO2: 19 mmol/L — ABNORMAL LOW (ref 22–32)
Calcium: 8.9 mg/dL (ref 8.9–10.3)
Chloride: 105 mmol/L (ref 98–111)
Creatinine, Ser: 0.9 mg/dL (ref 0.44–1.00)
GFR, Estimated: >60 mL/min (ref >60)
Glucose, Bld: 93 mg/dL (ref 70–99)
Potassium: 3.9 mmol/L (ref 3.5–5.1)
Sodium: 132 mmol/L — ABNORMAL LOW (ref 135–145)
Total Bilirubin: <0.1 mg/dL — ABNORMAL LOW (ref 0.3–1.2)
Total Protein: 6 g/dL — ABNORMAL LOW (ref 6.5–8.1)

## 2022-04-18 LAB — PROTEIN / CREATININE RATIO, URINE
Creatinine, Urine: 213.52 mg/dL
Protein Creatinine Ratio: 0.09 mg/mg{Cre} (ref 0.00–0.15)
Total Protein, Urine: 19 mg/dL

## 2022-04-18 LAB — CBC
HCT: 36.4 % (ref 36.0–46.0)
Hemoglobin: 12.2 g/dL (ref 12.0–15.0)
MCH: 24.8 pg — ABNORMAL LOW (ref 26.0–34.0)
MCHC: 33.5 g/dL (ref 30.0–36.0)
MCV: 74.1 fL — ABNORMAL LOW (ref 80.0–100.0)
Platelets: 189 10*3/uL (ref 150–400)
RBC: 4.91 MIL/uL (ref 3.87–5.11)
RDW: 15.9 % — ABNORMAL HIGH (ref 11.5–15.5)
WBC: 8.1 10*3/uL (ref 4.0–10.5)
nRBC: 0 % (ref 0.0–0.2)

## 2022-04-18 LAB — URINALYSIS, ROUTINE W REFLEX MICROSCOPIC
Bilirubin Urine: NEGATIVE
Glucose, UA: NEGATIVE mg/dL
Ketones, ur: NEGATIVE mg/dL
Nitrite: NEGATIVE
Protein, ur: NEGATIVE mg/dL
Specific Gravity, Urine: 1.02 (ref 1.005–1.030)
WBC, UA: >50 WBC/hpf — ABNORMAL HIGH (ref 0–5)
pH: 5 (ref 5.0–8.0)

## 2022-04-18 NOTE — Telephone Encounter (Signed)
Patient call our office regarding her elevated BP - 164/98   per Dr. Annamaria Boots - can take Tylenal for headache - if BP remains elevated for more then an hour - will need to go to MAU for evaluation.   Patient needs to keep Dawn Vaughn office informed as well.

## 2022-04-18 NOTE — Progress Notes (Signed)
Dr. Nehemiah Settle @ bedside talking with pt.

## 2022-04-18 NOTE — MAU Provider Note (Signed)
History     CSN: [id number]  Arrival date and time: 04/18/22 1454    Chief Complaint  Patient presents with   Hypertension   HPI This is a 41 year old G2P0100 with a history of diabetes, sickle cell trait, and a 25 weight loss due to incompetent cervix.  Patient had a cerclage placed.  Patient was admitted [redacted] weeks gestation due to severe range blood pressures at home and elevated blood pressures in the office.  She had a headache at that time of 3 out of 10.  She was eventually discharged home for outpatient management.  Today, the patient began to have another frontal headache that was 6 out of 10.  Her blood pressure was 160 over 90s.  She took a gram of Tylenol around noon and called her OBs office.  They instructed her to come here for evaluation.  She feels like the Tylenol is kicking in and her headache is currently 3 out of 10.  She reports good fetal movement, no vaginal bleeding or discharge.  OB History     Gravida  2   Para  1   Term      Preterm  1   AB      Living  0      SAB      IAB      Ectopic      Multiple      Live Births  0        Obstetric Comments  With first did not know she was preg, was still having "periods" every month; D&C for retained placenta         Past Medical History:  Diagnosis Date   Anemia    Diabetes mellitus without complication (HCC)    Sickle cell trait (HCC)     Past Surgical History:  Procedure Laterality Date   CERVICAL CERCLAGE Bilateral 11/19/2021   Procedure: CERCLAGE CERVICAL;  Surgeon: Osborn Coho, MD;  Location: [facility] LD ORS;  Service: Gynecology;  Laterality: Bilateral;   DILATION AND CURETTAGE OF UTERUS      Family History  Problem Relation Age of Onset   Hypertension Mother    Diabetes Mother    Heart disease Mother    Healthy Father     Social History   Tobacco Use   Smoking status: Never   Smokeless tobacco: Never  Vaping Use   Vaping Use: Never used  Substance Use Topics   Alcohol  use: Not Currently   Drug use: Never    Allergies: No Known Allergies  Medications Prior to Admission  Medication Sig Dispense Refill Last Dose   aspirin EC 81 MG tablet Take 81 mg by mouth daily. Swallow whole.   04/18/2022   metFORMIN (GLUCOPHAGE) 1000 MG tablet Take 1 tablet (1,000 mg total) by mouth 2 (two) times daily with a meal. 60 tablet 3 04/18/2022   Prenatal Vit-Fe Fumarate-FA (PRENATAL VITAMIN PO) Take 1 tablet by mouth daily with lunch.   04/18/2022   valACYclovir (VALTREX) 500 MG tablet Take 1 tablet (500 mg total) by mouth daily. 30 tablet 11 04/17/2022    Review of Systems Physical Exam   Blood pressure (!) 138/93, pulse (!) 107, temperature 98.6 F (37 C), resp. rate 18, height 5\' 3"  (1.6 m), weight 113.4 kg, last menstrual period 08/01/2021, SpO2 98 %.  Patient Vitals for the past 24 hrs:  BP Temp Pulse Resp SpO2 Height Weight  04/18/22 1647 132/88 -- 100 20 98 % -- --  04/18/22 1530 (!) 138/93 -- (!) 107 -- 98 % -- --  04/18/22 1526 (!) 148/86 -- (!) 105 -- -- -- --  04/18/22 1511 (!) 132/91 98.6 F (37 C) 98 18 -- 5\' 3"  (1.6 m) 113.4 kg     Physical Exam Vitals and nursing note reviewed.  Constitutional:      Appearance: Normal appearance.  Cardiovascular:     Rate and Rhythm: Normal rate and regular rhythm.     Pulses: Normal pulses.     Heart sounds: Normal heart sounds.  Pulmonary:     Effort: Pulmonary effort is normal.  Skin:    General: Skin is warm and dry.     Capillary Refill: Capillary refill takes less than 2 seconds.  Neurological:     General: No focal deficit present.     Mental Status: She is alert.  Psychiatric:        Mood and Affect: Mood normal.        Behavior: Behavior normal.        Thought Content: Thought content normal.        Judgment: Judgment normal.   Results for orders placed or performed during the hospital encounter of 04/18/22 (from the past 24 hour(s))  Urinalysis, Routine w reflex microscopic Urine, Clean Catch      Status: Abnormal   Collection Time: 04/18/22  3:25 PM  Result Value Ref Range   Color, Urine YELLOW YELLOW   APPearance CLOUDY (A) CLEAR   Specific Gravity, Urine 1.020 1.005 - 1.030   pH 5.0 5.0 - 8.0   Glucose, UA NEGATIVE NEGATIVE mg/dL   Hgb urine dipstick SMALL (A) NEGATIVE   Bilirubin Urine NEGATIVE NEGATIVE   Ketones, ur NEGATIVE NEGATIVE mg/dL   Protein, ur NEGATIVE NEGATIVE mg/dL   Nitrite NEGATIVE NEGATIVE   Leukocytes,Ua LARGE (A) NEGATIVE   RBC / HPF 21-50 0 - 5 RBC/hpf   WBC, UA >50 (H) 0 - 5 WBC/hpf   Bacteria, UA FEW (A) NONE SEEN   Squamous Epithelial / LPF 11-20 0 - 5  Protein / creatinine ratio, urine     Status: None   Collection Time: 04/18/22  3:25 PM  Result Value Ref Range   Creatinine, Urine 213.52 mg/dL   Total Protein, Urine 19 mg/dL   Protein Creatinine Ratio 0.09 0.00 - 0.15 mg/mg[Cre]  CBC     Status: Abnormal   Collection Time: 04/18/22  3:51 PM  Result Value Ref Range   WBC 8.1 4.0 - 10.5 K/uL   RBC 4.91 3.87 - 5.11 MIL/uL   Hemoglobin 12.2 12.0 - 15.0 g/dL   HCT 04/20/22 01.0 - 27.2 %   MCV 74.1 (L) 80.0 - 100.0 fL   MCH 24.8 (L) 26.0 - 34.0 pg   MCHC 33.5 30.0 - 36.0 g/dL   RDW 53.6 (H) 64.4 - 03.4 %   Platelets 189 150 - 400 K/uL   nRBC 0.0 0.0 - 0.2 %  Comprehensive metabolic panel     Status: Abnormal   Collection Time: 04/18/22  3:51 PM  Result Value Ref Range   Sodium 132 (L) 135 - 145 mmol/L   Potassium 3.9 3.5 - 5.1 mmol/L   Chloride 105 98 - 111 mmol/L   CO2 19 (L) 22 - 32 mmol/L   Glucose, Bld 93 70 - 99 mg/dL   BUN 9 6 - 20 mg/dL   Creatinine, Ser 04/20/22 0.44 - 1.00 mg/dL   Calcium 8.9 8.9 - 5.95 mg/dL   Total  Protein 6.0 (L) 6.5 - 8.1 g/dL   Albumin 2.4 (L) 3.5 - 5.0 g/dL   AST 18 15 - 41 U/L   ALT 19 0 - 44 U/L   Alkaline Phosphatase 115 38 - 126 U/L   Total Bilirubin <0.1 (L) 0.3 - 1.2 mg/dL   GFR, Estimated >16>60 >10>60 mL/min   Anion gap 8 5 - 15     MAU Course  Procedures NST:  Baseline: 140s  Variability:  moderate Accelerations: +++  Decelerations: none Contractions:   MDM  Assessment and Plan   1. Multigravida of advanced maternal age in third trimester   2. Maternal obesity syndrome, antepartum, third trimester   3. Antiphospholipid syndrome complicating pregnancy, antepartum (HCC)   4. Cervical cerclage suture present in third trimester   5. Sickle cell trait (HCC)   6. H/O intrauterine fetal death, currently pregnant   7. [redacted] weeks gestation of pregnancy   8. Chronic hypertension with superimposed pre-eclampsia    BPs have remained in the mild range Bloodwork completely normal. NST reactive. Has follow up tomorrow. Discussed severe symptoms and when to return.  Levie HeritageJacob J Kenith Trickel 04/18/2022, 3:49 PM

## 2022-04-18 NOTE — MAU Note (Signed)
.  Dawn Vaughn is a 41 y.o. at [redacted]w[redacted]d here in MAU reporting: sent from office with elevated b/p 164/98. Has had a mild headache all day took tylenol at 12 and headache is starting to ease off. Denies any visual disturbance  or abd discomfort. Has not felt baby move since 12:45. LMP:  Onset of complaint: today  Pain score: 3 Vitals:   04/18/22 1511  BP: (!) 132/91  Pulse: 98  Resp: 18  Temp: 98.6 F (37 C)     FHT:145 Lab orders placed from triage:

## 2022-04-19 ENCOUNTER — Ambulatory Visit: Payer: BC Managed Care – PPO | Admitting: *Deleted

## 2022-04-19 ENCOUNTER — Ambulatory Visit: Payer: BC Managed Care – PPO | Attending: Maternal & Fetal Medicine

## 2022-04-19 VITALS — BP 124/74 | HR 113

## 2022-04-19 DIAGNOSIS — D573 Sickle-cell trait: Secondary | ICD-10-CM | POA: Diagnosis present

## 2022-04-19 DIAGNOSIS — O09299 Supervision of pregnancy with other poor reproductive or obstetric history, unspecified trimester: Secondary | ICD-10-CM

## 2022-04-19 DIAGNOSIS — O99213 Obesity complicating pregnancy, third trimester: Secondary | ICD-10-CM

## 2022-04-19 DIAGNOSIS — O99119 Other diseases of the blood and blood-forming organs and certain disorders involving the immune mechanism complicating pregnancy, unspecified trimester: Secondary | ICD-10-CM | POA: Diagnosis present

## 2022-04-19 DIAGNOSIS — D6861 Antiphospholipid syndrome: Secondary | ICD-10-CM

## 2022-04-19 DIAGNOSIS — O3433 Maternal care for cervical incompetence, third trimester: Secondary | ICD-10-CM

## 2022-04-19 DIAGNOSIS — Z3A35 35 weeks gestation of pregnancy: Secondary | ICD-10-CM

## 2022-04-19 DIAGNOSIS — O24419 Gestational diabetes mellitus in pregnancy, unspecified control: Secondary | ICD-10-CM | POA: Diagnosis present

## 2022-04-19 DIAGNOSIS — E669 Obesity, unspecified: Secondary | ICD-10-CM

## 2022-04-19 DIAGNOSIS — O285 Abnormal chromosomal and genetic finding on antenatal screening of mother: Secondary | ICD-10-CM

## 2022-04-19 DIAGNOSIS — O133 Gestational [pregnancy-induced] hypertension without significant proteinuria, third trimester: Secondary | ICD-10-CM

## 2022-04-19 DIAGNOSIS — O09523 Supervision of elderly multigravida, third trimester: Secondary | ICD-10-CM

## 2022-04-19 DIAGNOSIS — O24415 Gestational diabetes mellitus in pregnancy, controlled by oral hypoglycemic drugs: Secondary | ICD-10-CM | POA: Diagnosis not present

## 2022-04-19 DIAGNOSIS — O09293 Supervision of pregnancy with other poor reproductive or obstetric history, third trimester: Secondary | ICD-10-CM

## 2022-04-26 ENCOUNTER — Ambulatory Visit (HOSPITAL_BASED_OUTPATIENT_CLINIC_OR_DEPARTMENT_OTHER): Payer: BC Managed Care – PPO

## 2022-04-26 ENCOUNTER — Ambulatory Visit: Payer: BC Managed Care – PPO | Admitting: *Deleted

## 2022-04-26 VITALS — BP 136/77 | HR 107

## 2022-04-26 DIAGNOSIS — O09293 Supervision of pregnancy with other poor reproductive or obstetric history, third trimester: Secondary | ICD-10-CM

## 2022-04-26 DIAGNOSIS — O99119 Other diseases of the blood and blood-forming organs and certain disorders involving the immune mechanism complicating pregnancy, unspecified trimester: Secondary | ICD-10-CM | POA: Insufficient documentation

## 2022-04-26 DIAGNOSIS — D573 Sickle-cell trait: Secondary | ICD-10-CM | POA: Insufficient documentation

## 2022-04-26 DIAGNOSIS — E669 Obesity, unspecified: Secondary | ICD-10-CM

## 2022-04-26 DIAGNOSIS — O133 Gestational [pregnancy-induced] hypertension without significant proteinuria, third trimester: Secondary | ICD-10-CM | POA: Diagnosis not present

## 2022-04-26 DIAGNOSIS — O09523 Supervision of elderly multigravida, third trimester: Secondary | ICD-10-CM

## 2022-04-26 DIAGNOSIS — O24419 Gestational diabetes mellitus in pregnancy, unspecified control: Secondary | ICD-10-CM | POA: Insufficient documentation

## 2022-04-26 DIAGNOSIS — D6861 Antiphospholipid syndrome: Secondary | ICD-10-CM

## 2022-04-26 DIAGNOSIS — O24415 Gestational diabetes mellitus in pregnancy, controlled by oral hypoglycemic drugs: Secondary | ICD-10-CM | POA: Diagnosis not present

## 2022-04-26 DIAGNOSIS — O99213 Obesity complicating pregnancy, third trimester: Secondary | ICD-10-CM | POA: Insufficient documentation

## 2022-04-26 DIAGNOSIS — O09299 Supervision of pregnancy with other poor reproductive or obstetric history, unspecified trimester: Secondary | ICD-10-CM | POA: Insufficient documentation

## 2022-04-26 DIAGNOSIS — O3433 Maternal care for cervical incompetence, third trimester: Secondary | ICD-10-CM

## 2022-04-26 DIAGNOSIS — Z3A36 36 weeks gestation of pregnancy: Secondary | ICD-10-CM

## 2022-04-27 ENCOUNTER — Inpatient Hospital Stay (HOSPITAL_COMMUNITY): Payer: BC Managed Care – PPO

## 2022-04-27 ENCOUNTER — Other Ambulatory Visit: Payer: Self-pay

## 2022-04-27 ENCOUNTER — Encounter (HOSPITAL_COMMUNITY): Payer: Self-pay | Admitting: Obstetrics & Gynecology

## 2022-04-27 ENCOUNTER — Inpatient Hospital Stay (HOSPITAL_COMMUNITY): Payer: BC Managed Care – PPO | Admitting: Anesthesiology

## 2022-04-27 ENCOUNTER — Inpatient Hospital Stay (HOSPITAL_COMMUNITY)
Admission: AD | Admit: 2022-04-27 | Discharge: 2022-04-30 | DRG: 806 | Disposition: A | Discharge status: Home / Self Care | Payer: BC Managed Care – PPO | Attending: Obstetrics & Gynecology | Admitting: Obstetrics & Gynecology

## 2022-04-27 DIAGNOSIS — O24425 Gestational diabetes mellitus in childbirth, controlled by oral hypoglycemic drugs: Secondary | ICD-10-CM | POA: Diagnosis present

## 2022-04-27 DIAGNOSIS — O9912 Other diseases of the blood and blood-forming organs and certain disorders involving the immune mechanism complicating childbirth: Secondary | ICD-10-CM | POA: Diagnosis present

## 2022-04-27 DIAGNOSIS — Z7982 Long term (current) use of aspirin: Secondary | ICD-10-CM

## 2022-04-27 DIAGNOSIS — O134 Gestational [pregnancy-induced] hypertension without significant proteinuria, complicating childbirth: Secondary | ICD-10-CM | POA: Diagnosis present

## 2022-04-27 DIAGNOSIS — Z3A37 37 weeks gestation of pregnancy: Secondary | ICD-10-CM | POA: Diagnosis not present

## 2022-04-27 DIAGNOSIS — O99214 Obesity complicating childbirth: Secondary | ICD-10-CM | POA: Diagnosis present

## 2022-04-27 DIAGNOSIS — O1002 Pre-existing essential hypertension complicating childbirth: Secondary | ICD-10-CM | POA: Diagnosis present

## 2022-04-27 DIAGNOSIS — O114 Pre-existing hypertension with pre-eclampsia, complicating childbirth: Secondary | ICD-10-CM | POA: Diagnosis present

## 2022-04-27 DIAGNOSIS — O99213 Obesity complicating pregnancy, third trimester: Secondary | ICD-10-CM | POA: Diagnosis present

## 2022-04-27 DIAGNOSIS — O365930 Maternal care for other known or suspected poor fetal growth, third trimester, not applicable or unspecified: Secondary | ICD-10-CM | POA: Diagnosis present

## 2022-04-27 DIAGNOSIS — D573 Sickle-cell trait: Secondary | ICD-10-CM | POA: Diagnosis present

## 2022-04-27 DIAGNOSIS — A6000 Herpesviral infection of urogenital system, unspecified: Secondary | ICD-10-CM | POA: Diagnosis present

## 2022-04-27 DIAGNOSIS — O09529 Supervision of elderly multigravida, unspecified trimester: Secondary | ICD-10-CM

## 2022-04-27 DIAGNOSIS — O99119 Other diseases of the blood and blood-forming organs and certain disorders involving the immune mechanism complicating pregnancy, unspecified trimester: Principal | ICD-10-CM | POA: Diagnosis present

## 2022-04-27 DIAGNOSIS — O09299 Supervision of pregnancy with other poor reproductive or obstetric history, unspecified trimester: Secondary | ICD-10-CM

## 2022-04-27 DIAGNOSIS — D6861 Antiphospholipid syndrome: Secondary | ICD-10-CM | POA: Diagnosis present

## 2022-04-27 DIAGNOSIS — O9832 Other infections with a predominantly sexual mode of transmission complicating childbirth: Secondary | ICD-10-CM | POA: Diagnosis present

## 2022-04-27 DIAGNOSIS — Z8619 Personal history of other infectious and parasitic diseases: Secondary | ICD-10-CM | POA: Diagnosis not present

## 2022-04-27 DIAGNOSIS — O24419 Gestational diabetes mellitus in pregnancy, unspecified control: Secondary | ICD-10-CM

## 2022-04-27 DIAGNOSIS — O119 Pre-existing hypertension with pre-eclampsia, unspecified trimester: Secondary | ICD-10-CM | POA: Diagnosis present

## 2022-04-27 DIAGNOSIS — O9902 Anemia complicating childbirth: Secondary | ICD-10-CM | POA: Diagnosis present

## 2022-04-27 DIAGNOSIS — B009 Herpesviral infection, unspecified: Secondary | ICD-10-CM | POA: Diagnosis not present

## 2022-04-27 LAB — CBC
HCT: 38.4 % (ref 36.0–46.0)
HCT: 35.9 % — ABNORMAL LOW (ref 36.0–46.0)
Hemoglobin: 12.4 g/dL (ref 12.0–15.0)
Hemoglobin: 11.7 g/dL — ABNORMAL LOW (ref 12.0–15.0)
MCH: 24.3 pg — ABNORMAL LOW (ref 26.0–34.0)
MCH: 24.6 pg — ABNORMAL LOW (ref 26.0–34.0)
MCHC: 32.3 g/dL (ref 30.0–36.0)
MCHC: 32.6 g/dL (ref 30.0–36.0)
MCV: 75.3 fL — ABNORMAL LOW (ref 80.0–100.0)
MCV: 75.4 fL — ABNORMAL LOW (ref 80.0–100.0)
Platelets: 189 10*3/uL (ref 150–400)
Platelets: 168 10*3/uL (ref 150–400)
RBC: 5.1 MIL/uL (ref 3.87–5.11)
RBC: 4.76 MIL/uL (ref 3.87–5.11)
RDW: 15.9 % — ABNORMAL HIGH (ref 11.5–15.5)
RDW: 15.9 % — ABNORMAL HIGH (ref 11.5–15.5)
WBC: 11 10*3/uL — ABNORMAL HIGH (ref 4.0–10.5)
WBC: 9.7 10*3/uL (ref 4.0–10.5)
nRBC: 0 % (ref 0.0–0.2)
nRBC: 0 % (ref 0.0–0.2)

## 2022-04-27 LAB — TYPE AND SCREEN
ABO/RH(D): O POS
Antibody Screen: NEGATIVE

## 2022-04-27 LAB — COMPREHENSIVE METABOLIC PANEL
ALT: 18 U/L (ref 0–44)
AST: 21 U/L (ref 15–41)
Albumin: 2.6 g/dL — ABNORMAL LOW (ref 3.5–5.0)
Alkaline Phosphatase: 126 U/L (ref 38–126)
Anion gap: 5 (ref 5–15)
BUN: 10 mg/dL (ref 6–20)
CO2: 20 mmol/L — ABNORMAL LOW (ref 22–32)
Calcium: 9 mg/dL (ref 8.9–10.3)
Chloride: 109 mmol/L (ref 98–111)
Creatinine, Ser: 0.71 mg/dL (ref 0.44–1.00)
GFR, Estimated: >60 mL/min (ref >60)
Glucose, Bld: 134 mg/dL — ABNORMAL HIGH (ref 70–99)
Potassium: 4 mmol/L (ref 3.5–5.1)
Sodium: 134 mmol/L — ABNORMAL LOW (ref 135–145)
Total Bilirubin: <0.1 mg/dL — ABNORMAL LOW (ref 0.3–1.2)
Total Protein: 6.4 g/dL — ABNORMAL LOW (ref 6.5–8.1)

## 2022-04-27 LAB — PROTEIN / CREATININE RATIO, URINE
Creatinine, Urine: 71.9 mg/dL
Protein Creatinine Ratio: 0.17 mg/mg{Cre} — ABNORMAL HIGH (ref 0.00–0.15)
Total Protein, Urine: 12 mg/dL

## 2022-04-27 LAB — GLUCOSE, CAPILLARY
Glucose-Capillary: 131 mg/dL — ABNORMAL HIGH (ref 70–99)
Glucose-Capillary: 131 mg/dL — ABNORMAL HIGH (ref 70–99)
Glucose-Capillary: 142 mg/dL — ABNORMAL HIGH (ref 70–99)
Glucose-Capillary: 146 mg/dL — ABNORMAL HIGH (ref 70–99)
Glucose-Capillary: 90 mg/dL (ref 70–99)

## 2022-04-27 LAB — RPR: RPR Ser Ql: NONREACTIVE

## 2022-04-27 MED ORDER — LACTATED RINGERS IV SOLN: 500.0000 mL | Freq: Once | INTRAVENOUS | Status: DC

## 2022-04-27 MED ORDER — SOD CITRATE-CITRIC ACID 500-334 MG/5ML PO SOLN: 30.0000 mL | ORAL | Status: DC | PRN

## 2022-04-27 MED ORDER — PHENYLEPHRINE 80 MCG/ML (10ML) SYRINGE FOR IV PUSH (FOR BLOOD PRESSURE SUPPORT)
80.0000 ug | PREFILLED_SYRINGE | INTRAVENOUS | Status: DC | PRN
  Filled 2022-04-27: qty 10

## 2022-04-27 MED ORDER — ONDANSETRON HCL 4 MG/2ML IJ SOLN: 4.0000 mg | Freq: Four times a day (QID) | INTRAMUSCULAR | Status: DC | PRN

## 2022-04-27 MED ORDER — OXYTOCIN-SODIUM CHLORIDE 30-0.9 UT/500ML-% IV SOLN
2.5000 [IU]/h | INTRAVENOUS | Status: DC
Start: 2022-04-27 — End: 2022-04-28

## 2022-04-27 MED ORDER — OXYTOCIN-SODIUM CHLORIDE 30-0.9 UT/500ML-% IV SOLN
1.0000 m[IU]/min | INTRAVENOUS | Status: DC
  Administered 2022-04-27: 1 m[IU]/min via INTRAVENOUS
  Filled 2022-04-27: qty 500

## 2022-04-27 MED ORDER — LABETALOL HCL 5 MG/ML IV SOLN: 40.0000 mg | INTRAVENOUS | Status: DC | PRN

## 2022-04-27 MED ORDER — DIPHENHYDRAMINE HCL 50 MG/ML IJ SOLN: 12.5000 mg | INTRAMUSCULAR | Status: DC | PRN

## 2022-04-27 MED ORDER — LABETALOL HCL 5 MG/ML IV SOLN: 80.0000 mg | INTRAVENOUS | Status: DC | PRN

## 2022-04-27 MED ORDER — MISOPROSTOL 50MCG HALF TABLET
50.0000 ug | ORAL_TABLET | ORAL | Status: DC | PRN
  Administered 2022-04-27: 50 ug via BUCCAL
  Filled 2022-04-27: qty 1

## 2022-04-27 MED ORDER — INSULIN ASPART 100 UNIT/ML IJ SOLN
0.0000 [IU] | Freq: Three times a day (TID) | INTRAMUSCULAR | Status: DC
  Administered 2022-04-27: 3 [IU] via SUBCUTANEOUS

## 2022-04-27 MED ORDER — FENTANYL-BUPIVACAINE-NACL 0.5-0.125-0.9 MG/250ML-% EP SOLN
12.0000 mL/h | EPIDURAL | Status: DC | PRN
  Administered 2022-04-27: 12 mL/h via EPIDURAL
  Filled 2022-04-27: qty 250

## 2022-04-27 MED ORDER — TERBUTALINE SULFATE 1 MG/ML IJ SOLN
0.2500 mg | Freq: Once | INTRAMUSCULAR | Status: DC | PRN
  Filled 2022-04-27: qty 1

## 2022-04-27 MED ORDER — TERBUTALINE SULFATE 1 MG/ML IJ SOLN: 0.2500 mg | Freq: Once | INTRAMUSCULAR | Status: DC | PRN

## 2022-04-27 MED ORDER — EPHEDRINE 5 MG/ML INJ: 10.0000 mg | INTRAVENOUS | Status: DC | PRN

## 2022-04-27 MED ORDER — LIDOCAINE HCL (PF) 1 % IJ SOLN
30.0000 mL | INTRAMUSCULAR | Status: AC | PRN
  Administered 2022-04-28: 30 mL via SUBCUTANEOUS
  Filled 2022-04-27: qty 30

## 2022-04-27 MED ORDER — LIDOCAINE HCL (PF) 1 % IJ SOLN
INTRAMUSCULAR | Status: DC | PRN
  Administered 2022-04-27 (×2): 5 mL via EPIDURAL

## 2022-04-27 MED ORDER — ACETAMINOPHEN 325 MG PO TABS: 650.0000 mg | ORAL_TABLET | ORAL | Status: DC | PRN

## 2022-04-27 MED ORDER — OXYTOCIN BOLUS FROM INFUSION
333.0000 mL | Freq: Once | INTRAVENOUS | Status: AC
  Administered 2022-04-28: 333 mL via INTRAVENOUS

## 2022-04-27 MED ORDER — MISOPROSTOL 25 MCG QUARTER TABLET: 25.0000 ug | ORAL_TABLET | ORAL | Status: DC | PRN

## 2022-04-27 MED ORDER — PHENYLEPHRINE 80 MCG/ML (10ML) SYRINGE FOR IV PUSH (FOR BLOOD PRESSURE SUPPORT): 80.0000 ug | PREFILLED_SYRINGE | INTRAVENOUS | Status: DC | PRN

## 2022-04-27 MED ORDER — HYDRALAZINE HCL 20 MG/ML IJ SOLN: 10.0000 mg | INTRAMUSCULAR | Status: DC | PRN

## 2022-04-27 MED ORDER — LACTATED RINGERS IV SOLN: 500.0000 mL | INTRAVENOUS | Status: DC | PRN

## 2022-04-27 MED ORDER — TERBUTALINE SULFATE 1 MG/ML IJ SOLN
0.2500 mg | Freq: Once | INTRAMUSCULAR | Status: AC | PRN
  Administered 2022-04-27: 0.25 mg via SUBCUTANEOUS

## 2022-04-27 MED ORDER — LABETALOL HCL 5 MG/ML IV SOLN
20.0000 mg | INTRAVENOUS | Status: DC | PRN
  Administered 2022-04-27: 20 mg via INTRAVENOUS
  Filled 2022-04-27: qty 4

## 2022-04-27 MED ORDER — METFORMIN HCL 500 MG PO TABS
1000.0000 mg | ORAL_TABLET | Freq: Two times a day (BID) | ORAL | Status: DC
  Administered 2022-04-27 (×2): 1000 mg via ORAL
  Filled 2022-04-27 (×3): qty 2

## 2022-04-27 MED ORDER — LACTATED RINGERS IV SOLN: INTRAVENOUS | Status: DC

## 2022-04-27 NOTE — Progress Notes (Signed)
Reviewed tracing remotely at 1915 cat 1 S/p epidural  VS reviewed

## 2022-04-27 NOTE — Plan of Care (Signed)

## 2022-04-27 NOTE — Progress Notes (Addendum)
Called by RN to evaluate tracing. CNM reviewed tracing and called MD. Plan developed to give terbutaline and start O2 @ 10 L/min via non-re breather.  Subjective:    Discussed FHR tracing and interventions. Questions answered.   Objective:    VS: BP (!) 159/67   Pulse (!) 104   Temp 98.9 F (37.2 C) (Axillary)   Resp 17   Ht 5\' 3"  (1.6 m)   Wt 115.2 kg   LMP 08/01/2021   SpO2 97%   BMI 44.99 kg/m  FHR : baseline 155 / variability moderate / accelerations present / variable, late, and prolonged decelerations Toco: contractions every 3-7 minutes  Membranes: intact Dilation: Fingertip Effacement (%): 60 Cervical Position: Posterior Station: -3 Presentation: Vertex Exam by:: [email or web address], CNM   Assessment/Plan:   41 y.o. G2P0100 [redacted]w[redacted]d IOL  Labor:  S/P Cytotec x1 50 mcg buccal  , unable to place cervical balloon GHTN:  no signs or symptoms of toxicity, intake and ouput balanced, and CMP normal, PCR 0.17 Fetal Wellbeing:  Category II Pain Control:  Labor support without medications I/D:   GBS neg Anticipated MOD:  NSVD  [redacted]w[redacted]d MSN, CNM 04/27/2022 5:47 AM

## 2022-04-27 NOTE — Anesthesia Procedure Notes (Signed)
Epidural Patient location during procedure: OB Start time: 04/27/2022 5:50 PM End time: 04/27/2022 6:00 PM  Staffing Anesthesiologist: Albertha Ghee, MD Performed: anesthesiologist   Preanesthetic Checklist Completed: patient identified, IV checked, site marked, risks and benefits discussed, monitors and equipment checked, pre-op evaluation and timeout performed  Epidural Patient position: sitting Prep: DuraPrep Patient monitoring: heart rate, cardiac monitor, continuous pulse ox and blood pressure Approach: midline Location: L2-L3 Injection technique: LOR saline  Needle:  Needle type: Tuohy  Needle gauge: 17 G Needle length: 9 cm Needle insertion depth: 8 cm Catheter type: closed end flexible Catheter size: 19 Gauge Catheter at skin depth: 14 cm Test dose: negative and Other  Assessment Events: blood not aspirated, injection not painful, no injection resistance and negative IV test  Additional Notes Informed consent obtained prior to proceeding including risk of failure, 1% risk of PDPH, risk of minor discomfort and bruising.  Discussed rare but serious complications including epidural abscess, permanent nerve injury, epidural hematoma.  Discussed alternatives to epidural analgesia and patient desires to proceed.  Timeout performed pre-procedure verifying patient name, procedure, and platelet count.  Patient tolerated procedure well. Reason for block:procedure for pain

## 2022-04-27 NOTE — Progress Notes (Signed)
Discussed with patient getting an epidural at 1609. Pt was informed of the epidural process and elected to wait to get her epidural when she was more uncomfortable as she wanted to be more active and mobile.

## 2022-04-27 NOTE — Progress Notes (Addendum)
FHR recovered with interventions Cat 1 CNM unable to place [facility] balloon Dr. Alesia Richards notified  Will discuss next steps of induction with on-coming provider.   CBG (last 3)  Recent Labs    04/27/22 0143 04/27/22 0234 04/27/22 0620  GLUCAP 142* 131* 131*   Dr. Alesia Richards notified of CBG results. Plan developed to continue home regime of Metformin and add sliding scale insulin for coverage.   Burman Foster, MSN, CNM 04/27/2022 7:36 AM

## 2022-04-27 NOTE — Anesthesia Preprocedure Evaluation (Addendum)
Anesthesia Evaluation  Patient identified by MRN, date of birth, ID band Patient awake    Reviewed: Allergy & Precautions, Patient's Chart, lab work & pertinent test results  Airway Mallampati: II   Neck ROM: full    Dental   Pulmonary neg pulmonary ROS,    breath sounds clear to auscultation       Cardiovascular hypertension,  Rhythm:regular Rate:Normal     Neuro/Psych negative neurological ROS  negative psych ROS   GI/Hepatic Neg liver ROS, GERD  ,  Endo/Other  diabetes, Well Controlled, Gestational, Oral Hypoglycemic AgentsMorbid obesity  Renal/GU negative Renal ROS  negative genitourinary   Musculoskeletal   Abdominal (+) + obese,   Peds  Hematology  (+) Blood dyscrasia, Sickle cell trait and anemia ,   Anesthesia Other Findings   Reproductive/Obstetrics (+) Pregnancy AMA  GDM HSV Gestational HTN Hx/o incompetent cervix S/P Cervical cerclage removed in office yesterday                            Anesthesia Physical Anesthesia Plan  ASA: 3  Anesthesia Plan: Epidural   Post-op Pain Management:    Induction:   PONV Risk Score and Plan:   Airway Management Planned: Natural Airway  Additional Equipment:   Intra-op Plan:   Post-operative Plan:   Informed Consent: I have reviewed the patients History and Physical, chart, labs and discussed the procedure including the risks, benefits and alternatives for the proposed anesthesia with the patient or authorized representative who has indicated his/her understanding and acceptance.       Plan Discussed with: Anesthesiologist  Anesthesia Plan Comments:         Anesthesia Quick Evaluation

## 2022-04-27 NOTE — Progress Notes (Signed)
Last CBG reading 100 at 1030 am , did not transfer to  chart

## 2022-04-27 NOTE — Progress Notes (Signed)
PT CBG obtained: 98

## 2022-04-27 NOTE — Progress Notes (Signed)
MD LABOR PROGRESS NOTE  Subjective:   Patient getting more comfortable s/p epidural, she feels more pressure  Objective:    VS: BP (!) 157/94   Pulse 95   Temp 98 F (36.7 C)   Resp 17   Ht 5\' 3"  (1.6 m)   Wt 115.2 kg   LMP 08/01/2021   SpO2 98%   BMI 44.99 kg/m  FHR : baseline 150 / variability moderate / accelerations present / No decelerations Toco: irregular Membranes: s/p AROM earlier Dilation: 4.5 Effacement (%): 70 Cervical Position: mid Station: -1 IUPC replaced Exam by:: Dr. [email or web address] Pitocin 6 mU/min  Assessment/Plan:   41 y.o. G2P0100 [redacted]w[redacted]d s/p IOL for [facility] w/ superimposed pre E (dx April 23) No signs or symptoms of preeclampsia One severe range BP s/p epidural - one dose of IV labetalol given Labor progressing normally, will continue to increase pitocin to obtain adequate MVUs (IUPC replaced)  Labor: Progressing normally Preeclampsia:  no signs or symptoms of toxicity and intake and ouput balanced Fetal Wellbeing:  Category I Pain Control:  Epidural I/D:  n/a Anticipated MOD:   unknown   April 25 MD 04/27/2022 6:31 PM

## 2022-04-27 NOTE — Progress Notes (Signed)
MD LABOR PROGRESS NOTE  Dawn Vaughn is a 41 y.o. G2P0100 at [redacted]w[redacted]d  admitted for induction of labor due to Gestational diabetes and Hypertension.  Subjective:  Patient starting to feel more cramping  Objective:  BP (!) 137/95   Pulse 98   Temp 97.8 F (36.6 C)   Resp 18   Ht 5\' 3"  (1.6 m)   Wt 115.2 kg   LMP 08/01/2021   SpO2 99%   BMI 44.99 kg/m   Total I/O In: 240 [P.O.:240] Out: -   FHT:  FHR: Baseline 150s with moderate variability. 15x15 accelerations present. Occasional subtle late decelerations nadir 120's with return to baseline.  UC:   irregular, every 3-6 minutes SVE:   Dilation: 3 Effacement (%): 40 Station: -2 Exam by:: Dr. [email or web address] AROM: clear Internal monitors placed (IUPC and FSE)  Pitocin @ 4 mu/min  Labs: Lab Results  Component Value Date   WBC 9.7 04/27/2022   HGB 11.7 (L) 04/27/2022   HCT 35.9 (L) 04/27/2022   MCV 75.4 (L) 04/27/2022   PLT 168 04/27/2022    Assessment / Plan: 41 y.o. G2P0100 [redacted]w[redacted]d  in early labor being induced for A2 GDM and  Chronic HTN with superimposed preeclampsia  Labor: Progressing normally Fetal Wellbeing:  Category II - but overall reassuring - will continue to increase pitocin  Pain Control:   Labor support currently advised epidural Anticipated MOD:   unknown     [redacted]w[redacted]d, MD  04/27/2022 3:02 PM

## 2022-04-27 NOTE — Progress Notes (Signed)
Glucose Monitor not translating last reading 114

## 2022-04-27 NOTE — H&P (Cosign Needed Addendum)
OB ADMISSION/ HISTORY & PHYSICAL:  Admission Date: 04/27/2022 12:01 AM  Admit Diagnosis: A2DM, GHTN, Maternal obesity  Dawn Vaughn is a 41 y.o. female G2P0100 [redacted]w[redacted]d presenting for medical IOL. Endorses active FM, denies LOF and vaginal bleeding. Denies HA, visual changes, and RUQ pain.   History of current pregnancy: G2P0100   Patient entered care with [facility] at 10+6 wks.   EDC 05/18/22 by 7+2 wk U/S.   Anatomy scan:  18 wks, complete w/ anterior placenta.   Antenatal testing: for high risk pregnancy w/ multiple co-morbidities  Last evaluation: 37+6  wks vertex/ Anterior placenta/ AFI  subjectively low/ EFW 6# 5oz (38%)  Significant prenatal events:  Patient Active Problem List   Diagnosis Date Noted   Hx of trichomoniasis 04/27/2022   HSV-2 infection 04/27/2022   Chronic hypertension with superimposed pre-eclampsia 03/29/22   Gestational diabetes mellitus, class A2 03/29/2022   Maternal obesity syndrome, antepartum, third trimester 2022-03-29   Antiphospholipid syndrome complicating pregnancy, antepartum ([place]) 03/29/22   AMA (advanced maternal age) multigravida 35+ 03/29/22   H/O intrauterine fetal death, currently pregnant 2022/03/29   Sickle cell trait ([place]) 12/15/2021    Prenatal Labs: ABO, Rh: --/--/O POS Mar 30, 2023 1733) Antibody: NEG 03-30-23 1733) Rubella: Immune (10/24 0000)  RPR: NON REACTIVE (12/23 1339)  HBsAg: Negative (10/24 0000)  HIV: Non-reactive (10/24 0000)  GTT: A2DM GBS:   negative GC/CHL: neg/neg Genetics: low-risk female, AFP normal Tdap: Declined Influenza: current   OB History  Gravida Para Term Preterm AB Living  2 1   1    0  SAB IAB Ectopic Multiple Live Births          0    # Outcome Date GA Lbr Len/2nd Weight Sex Delivery Anes PTL Lv  2 Current           1 Preterm  [redacted]w[redacted]d       FD    Obstetric Comments  With first did not know she was preg, was still having "periods" every month; D&C for retained placenta    Medical / Surgical  History: Past medical history:  Past Medical History:  Diagnosis Date   Anemia    Diabetes mellitus without complication ([place])    Sickle cell trait ([place])     Past surgical history:  Past Surgical History:  Procedure Laterality Date   CERVICAL CERCLAGE Bilateral 11/19/2021   Procedure: CERCLAGE CERVICAL;  Surgeon: Everett Graff, MD;  Location: [facility] LD ORS;  Service: Gynecology;  Laterality: Bilateral;   DILATION AND CURETTAGE OF UTERUS     Family History:  Family History  Problem Relation Age of Onset   Hypertension Mother    Diabetes Mother    Heart disease Mother    Healthy Father     Social History:  reports that she has never smoked. She has never used smokeless tobacco. She reports that she does not currently use alcohol. She reports that she does not use drugs.  Allergies: Patient has no known allergies.   Current Medications at time of admission:  Prior to Admission medications   Medication Sig Start Date End Date Taking? Authorizing Provider  aspirin EC 81 MG tablet Take 81 mg by mouth daily. Swallow whole.    [provider]  metFORMIN (GLUCOPHAGE) 1000 MG tablet Take 1 tablet (1,000 mg total) by mouth 2 (two) times daily with a meal. 03/27/22   Everett Graff, MD  Prenatal Vit-Fe Fumarate-FA (PRENATAL VITAMIN PO) Take 1 tablet by mouth daily with lunch.  [provider]  valACYclovir (VALTREX) 500 MG tablet Take 1 tablet (500 mg total) by mouth daily. 03/27/22   Everett Graff, MD    Review of Systems: Constitutional: Negative   HENT: Negative   Eyes: Negative   Respiratory: Negative   Cardiovascular: Negative   Gastrointestinal: Negative  Genitourinary: neg for bloody show, neg for LOF   Musculoskeletal: Negative   Skin: Negative   Neurological: Negative   Endo/Heme/Allergies: Negative   Psychiatric/Behavioral: Negative    Physical Exam: VS: Blood pressure (!) 155/96, pulse (!) 114, temperature 98.9 F (37.2 C), temperature source  Axillary, resp. rate 19, last menstrual period 08/01/2021. AAO x3, no signs of distress Cardiovascular: RRR Respiratory: Lung fields clear to ausculation GU/GI: Abdomen gravid, non-tender, non-distended, active FM, vertex, EFW 6.5# per Leopold's Extremities: trace edema, negative for pain, tenderness, and cords  Cervical exam:Dilation: Fingertip Effacement (%): 60 Station: -3 Exam by:: Clois Dupes, CNM FHR: baseline rate 150 / variability moderate / accelerations present / absent decelerations TOCO: irreg ctx   Prenatal Transfer Tool  Maternal Diabetes: Yes:  Diabetes Type:  Insulin/Medication controlled Genetic Screening: Normal Maternal Ultrasounds/Referrals: Normal Fetal Ultrasounds or other Referrals:  Referred to Materal Fetal Medicine  Maternal Substance Abuse:  No Significant Maternal Medications:  Meds include: Other: Metformin Significant Maternal Lab Results: Group B Strep negative    Assessment: 41 y.o. G2P0100 [redacted]w[redacted]d IOL  FHR category 1 GBS neg HSV    -Valtrex since 33 weeks    -denies prodromal S/S    -negative speculum exam Pain management plan: per pt's request   Plan:  Admit to L&D Routine admission orders Epidural PRN Cytotec for ripening Dr Alesia Richards notified of admission and plan of care  Arrie Eastern MSN, CNM 04/27/2022 12:08 AM

## 2022-04-28 LAB — COMPREHENSIVE METABOLIC PANEL
ALT: 16 U/L (ref 0–44)
AST: 26 U/L (ref 15–41)
Albumin: 2.1 g/dL — ABNORMAL LOW (ref 3.5–5.0)
Alkaline Phosphatase: 104 U/L (ref 38–126)
Anion gap: 8 (ref 5–15)
BUN: 6 mg/dL (ref 6–20)
CO2: 19 mmol/L — ABNORMAL LOW (ref 22–32)
Calcium: 8.2 mg/dL — ABNORMAL LOW (ref 8.9–10.3)
Chloride: 109 mmol/L (ref 98–111)
Creatinine, Ser: 0.72 mg/dL (ref 0.44–1.00)
GFR, Estimated: >60 mL/min (ref >60)
Glucose, Bld: 156 mg/dL — ABNORMAL HIGH (ref 70–99)
Potassium: 4.1 mmol/L (ref 3.5–5.1)
Sodium: 136 mmol/L (ref 135–145)
Total Bilirubin: 0.2 mg/dL — ABNORMAL LOW (ref 0.3–1.2)
Total Protein: 5.2 g/dL — ABNORMAL LOW (ref 6.5–8.1)

## 2022-04-28 LAB — CBC
HCT: 34.6 % — ABNORMAL LOW (ref 36.0–46.0)
HCT: 32.6 % — ABNORMAL LOW (ref 36.0–46.0)
Hemoglobin: 11.2 g/dL — ABNORMAL LOW (ref 12.0–15.0)
Hemoglobin: 10.8 g/dL — ABNORMAL LOW (ref 12.0–15.0)
MCH: 24.5 pg — ABNORMAL LOW (ref 26.0–34.0)
MCH: 24.9 pg — ABNORMAL LOW (ref 26.0–34.0)
MCHC: 32.4 g/dL (ref 30.0–36.0)
MCHC: 33.1 g/dL (ref 30.0–36.0)
MCV: 75.5 fL — ABNORMAL LOW (ref 80.0–100.0)
MCV: 75.1 fL — ABNORMAL LOW (ref 80.0–100.0)
Platelets: 181 10*3/uL (ref 150–400)
Platelets: 173 10*3/uL (ref 150–400)
RBC: 4.58 MIL/uL (ref 3.87–5.11)
RBC: 4.34 MIL/uL (ref 3.87–5.11)
RDW: 15.9 % — ABNORMAL HIGH (ref 11.5–15.5)
RDW: 16 % — ABNORMAL HIGH (ref 11.5–15.5)
WBC: 16 10*3/uL — ABNORMAL HIGH (ref 4.0–10.5)
WBC: 15.7 10*3/uL — ABNORMAL HIGH (ref 4.0–10.5)
nRBC: 0 % (ref 0.0–0.2)
nRBC: 0 % (ref 0.0–0.2)

## 2022-04-28 LAB — MAGNESIUM: Magnesium: 3.9 mg/dL — ABNORMAL HIGH (ref 1.7–2.4)

## 2022-04-28 LAB — GLUCOSE, CAPILLARY: Glucose-Capillary: 104 mg/dL — ABNORMAL HIGH (ref 70–99)

## 2022-04-28 MED ORDER — OXYCODONE HCL 5 MG PO TABS
5.0000 mg | ORAL_TABLET | ORAL | Status: DC | PRN
  Administered 2022-04-28 – 2022-04-30 (×2): 5 mg via ORAL
  Filled 2022-04-28 (×2): qty 1

## 2022-04-28 MED ORDER — MAGNESIUM SULFATE 40 GM/1000ML IV SOLN
2.0000 g/h | INTRAVENOUS | Status: DC
  Administered 2022-04-28 – 2022-04-29 (×2): 2 g/h via INTRAVENOUS
  Filled 2022-04-28 (×2): qty 1000

## 2022-04-28 MED ORDER — ONDANSETRON HCL 4 MG PO TABS: 4.0000 mg | ORAL_TABLET | ORAL | Status: DC | PRN

## 2022-04-28 MED ORDER — OXYTOCIN-SODIUM CHLORIDE 30-0.9 UT/500ML-% IV SOLN: 2.5000 [IU]/h | INTRAVENOUS | Status: DC | PRN

## 2022-04-28 MED ORDER — DIPHENHYDRAMINE HCL 25 MG PO CAPS: 25.0000 mg | ORAL_CAPSULE | Freq: Four times a day (QID) | ORAL | Status: DC | PRN

## 2022-04-28 MED ORDER — SENNOSIDES-DOCUSATE SODIUM 8.6-50 MG PO TABS
2.0000 | ORAL_TABLET | Freq: Every day | ORAL | Status: DC
  Administered 2022-04-29 – 2022-04-30 (×2): 2 via ORAL
  Filled 2022-04-28 (×2): qty 2

## 2022-04-28 MED ORDER — DIBUCAINE (PERIANAL) 1 % EX OINT: 1.0000 "application " | TOPICAL_OINTMENT | CUTANEOUS | Status: DC | PRN

## 2022-04-28 MED ORDER — PRENATAL MULTIVITAMIN CH
1.0000 | ORAL_TABLET | Freq: Every day | ORAL | Status: DC
  Administered 2022-04-28 – 2022-04-30 (×3): 1 via ORAL
  Filled 2022-04-28 (×3): qty 1

## 2022-04-28 MED ORDER — COCONUT OIL OIL
1.0000 "application " | TOPICAL_OIL | Status: DC | PRN
  Administered 2022-04-28: 1 via TOPICAL

## 2022-04-28 MED ORDER — BENZOCAINE-MENTHOL 20-0.5 % EX AERO
1.0000 "application " | INHALATION_SPRAY | CUTANEOUS | Status: DC | PRN
  Administered 2022-04-28 – 2022-04-30 (×2): 1 via TOPICAL
  Filled 2022-04-28 (×2): qty 56

## 2022-04-28 MED ORDER — ZOLPIDEM TARTRATE 5 MG PO TABS: 5.0000 mg | ORAL_TABLET | Freq: Every evening | ORAL | Status: DC | PRN

## 2022-04-28 MED ORDER — OXYCODONE HCL 5 MG PO TABS: 10.0000 mg | ORAL_TABLET | ORAL | Status: DC | PRN

## 2022-04-28 MED ORDER — ONDANSETRON HCL 4 MG/2ML IJ SOLN: 4.0000 mg | INTRAMUSCULAR | Status: DC | PRN

## 2022-04-28 MED ORDER — SIMETHICONE 80 MG PO CHEW: 80.0000 mg | CHEWABLE_TABLET | ORAL | Status: DC | PRN

## 2022-04-28 MED ORDER — IBUPROFEN 600 MG PO TABS
600.0000 mg | ORAL_TABLET | Freq: Four times a day (QID) | ORAL | Status: DC
Start: 2022-04-28 — End: 2022-05-01
  Administered 2022-04-28 – 2022-04-30 (×10): 600 mg via ORAL
  Filled 2022-04-28 (×11): qty 1

## 2022-04-28 MED ORDER — WITCH HAZEL-GLYCERIN EX PADS: 1.0000 "application " | MEDICATED_PAD | CUTANEOUS | Status: DC | PRN

## 2022-04-28 MED ORDER — MAGNESIUM SULFATE BOLUS VIA INFUSION
4.0000 g | Freq: Once | INTRAVENOUS | Status: AC
  Administered 2022-04-28: 4 g via INTRAVENOUS
  Filled 2022-04-28: qty 1000

## 2022-04-28 MED ORDER — TETANUS-DIPHTH-ACELL PERTUSSIS 5-2.5-18.5 LF-MCG/0.5 IM SUSY: 0.5000 mL | PREFILLED_SYRINGE | Freq: Once | INTRAMUSCULAR | Status: DC

## 2022-04-28 MED ORDER — ACETAMINOPHEN 325 MG PO TABS
650.0000 mg | ORAL_TABLET | ORAL | Status: DC | PRN
  Administered 2022-04-28 – 2022-04-30 (×3): 650 mg via ORAL
  Filled 2022-04-28 (×3): qty 2

## 2022-04-28 MED ORDER — LACTATED RINGERS IV SOLN: INTRAVENOUS | Status: DC

## 2022-04-28 NOTE — Lactation Note (Addendum)
This note was copied from a baby's chart. Lactation Consultation Note  Patient Name: Dawn Vaughn [id number] Date: 04/28/2022 Reason for consult: Follow-up assessment;Early term 37-38.6wks;1st time breastfeeding;Maternal endocrine disorder Age:39 hours  P1, Baby [redacted]w[redacted]d.  Mother on MgSO4.  GDM. Reviewed hand expression. Attempted latching without and without NS. Baby sustained latch with #20NS for 5 min before tiring.. Set up DEBP with 21 and 24 flanges. Mother will try with 21 flanges and if she is uncomfortable she will change to 24 flanges. Mother has evenflow DEBP.  Discussed possibly finding a stronger pump. Recommend feeding and pumping q 3hours. Mother will supplement with formula.  Reviewed LPI volume guidelines. Lactation information sheet given.   Maternal Data Has patient been taught Hand Expression?: Yes Does the patient have breastfeeding experience prior to this delivery?: No  Feeding Mother's Current Feeding Choice: Breast Milk and Formula  LATCH Score Latch: Repeated attempts needed to sustain latch, nipple held in mouth throughout feeding, stimulation needed to elicit sucking reflex.  Audible Swallowing: None  Type of Nipple: Everted at rest and after stimulation (short shaft)  Comfort (Breast/Nipple): Soft / non-tender  Hold (Positioning): Assistance needed to correctly position infant at breast and maintain latch.  LATCH Score: 6   Lactation Tools Discussed/Used Tools: Pump;Flanges;Nipple Shields  Interventions Interventions: Breast feeding basics reviewed;Assisted with latch;Skin to skin;Hand express;Pre-pump if needed;Adjust position;Support pillows;Hand pump;DEBP;Education;LC Services brochure  Discharge Pump: DEBP;Personal (Evenflow)  Consult Status Consult Status: Follow-up Date: 04/28/22 Follow-up type: In-patient    Dahlia Byes [facility] 04/28/2022, 3:35 PM

## 2022-04-28 NOTE — Lactation Note (Signed)
This note was copied from a baby's chart. Lactation Consultation Note  Patient Name: Dawn Vaughn [id number] Date: 04/28/2022 Reason for consult: L&D Initial assessment;Early term 37-38.6wks;1st time breastfeeding Age:39 hours, ETI female infant. LC entered the room, mom was doing skin to skin with infant. Per mom, RN in L&D taught her how to hand express infant received few drops of colostrum. Mom attempted latch infant on her left breast using the football hold, infant did not sustain latch was off and on breast, tongue thrusting and licking breast, infant latched for 3 minutes. Mom could benefit from hand pump to pre-pump breast see latch score below regarding mom's nipple type. Afterwards mom did more skin to skin as LC left the room. Mom knows to continue to breastfeed infant by cues, on demand, skin to skin and ask RN/ LC  on MBU for further latch assistance if needed. [place] congratulated mom on the birth of her baby.  Maternal Data Has patient been taught Hand Expression?: Yes Does the patient have breastfeeding experience prior to this delivery?: No  Feeding Mother's Current Feeding Choice: Breast Milk  LATCH Score Latch: Repeated attempts needed to sustain latch, nipple held in mouth throughout feeding, stimulation needed to elicit sucking reflex.  Audible Swallowing: None  Type of Nipple: Flat  Comfort (Breast/Nipple): Soft / non-tender  Hold (Positioning): Assistance needed to correctly position infant at breast and maintain latch.  LATCH Score: 5   Lactation Tools Discussed/Used    Interventions Interventions: Assisted with latch;Skin to skin;Breast compression;Adjust position;Support pillows;Position options;Education  Discharge Pump: Personal (Per mom ,she has DEBP at home but she cannot remember brand name.)  Consult Status Consult Status: Follow-up from L&D    Vicente Serene 04/28/2022, 2:32 AM

## 2022-04-28 NOTE — Anesthesia Postprocedure Evaluation (Signed)
Anesthesia Post Note  Patient: Dawn Vaughn  Procedure(s) Performed: AN AD HOC LABOR EPIDURAL     Patient location during evaluation: Mother Baby Anesthesia Type: Epidural Level of consciousness: awake and alert Pain management: pain level controlled Vital Signs Assessment: post-procedure vital signs reviewed and stable Respiratory status: spontaneous breathing, nonlabored ventilation and respiratory function stable Cardiovascular status: stable Postop Assessment: no headache, no backache and epidural receding Anesthetic complications: no   No notable events documented.  Last Vitals:  Vitals:   04/28/22 0700 04/28/22 0801  BP: 132/84 129/81  Pulse: (!) 108 (!) 113  Resp: 16 18  Temp:  36.6 C  SpO2:  96%    Last Pain:  Vitals:   04/28/22 0801  TempSrc: Oral  PainSc:    Pain Goal:                   Dawn Vaughn

## 2022-04-29 LAB — CBC
HCT: 31.6 % — ABNORMAL LOW (ref 36.0–46.0)
Hemoglobin: 10.2 g/dL — ABNORMAL LOW (ref 12.0–15.0)
MCH: 24.2 pg — ABNORMAL LOW (ref 26.0–34.0)
MCHC: 32.3 g/dL (ref 30.0–36.0)
MCV: 75.1 fL — ABNORMAL LOW (ref 80.0–100.0)
Platelets: 168 10*3/uL (ref 150–400)
RBC: 4.21 MIL/uL (ref 3.87–5.11)
RDW: 15.7 % — ABNORMAL HIGH (ref 11.5–15.5)
WBC: 12.1 10*3/uL — ABNORMAL HIGH (ref 4.0–10.5)
nRBC: 0 % (ref 0.0–0.2)

## 2022-04-29 LAB — COMPREHENSIVE METABOLIC PANEL
ALT: 16 U/L (ref 0–44)
AST: 22 U/L (ref 15–41)
Albumin: 2.1 g/dL — ABNORMAL LOW (ref 3.5–5.0)
Alkaline Phosphatase: 92 U/L (ref 38–126)
Anion gap: 7 (ref 5–15)
BUN: 7 mg/dL (ref 6–20)
CO2: 22 mmol/L (ref 22–32)
Calcium: 7.5 mg/dL — ABNORMAL LOW (ref 8.9–10.3)
Chloride: 108 mmol/L (ref 98–111)
Creatinine, Ser: 0.7 mg/dL (ref 0.44–1.00)
GFR, Estimated: >60 mL/min (ref >60)
Glucose, Bld: 153 mg/dL — ABNORMAL HIGH (ref 70–99)
Potassium: 3.8 mmol/L (ref 3.5–5.1)
Sodium: 137 mmol/L (ref 135–145)
Total Bilirubin: 0.3 mg/dL (ref 0.3–1.2)
Total Protein: 5.3 g/dL — ABNORMAL LOW (ref 6.5–8.1)

## 2022-04-29 LAB — GLUCOSE, CAPILLARY: Glucose-Capillary: 173 mg/dL — ABNORMAL HIGH (ref 70–99)

## 2022-04-29 MED ORDER — NIFEDIPINE ER OSMOTIC RELEASE 60 MG PO TB24
60.0000 mg | ORAL_TABLET | Freq: Every day | ORAL | Status: DC
  Administered 2022-04-29 – 2022-04-30 (×2): 60 mg via ORAL
  Filled 2022-04-29 (×2): qty 1

## 2022-04-29 MED ORDER — DOCUSATE SODIUM 100 MG PO CAPS: 100.0000 mg | ORAL_CAPSULE | Freq: Two times a day (BID) | ORAL | Status: DC | PRN

## 2022-04-29 NOTE — Progress Notes (Addendum)
PPD# 1 SVD w/ 1st degree;Perineal;Vaginal, bilateral labial abrasions Information for the patient's newborn:  Dawn, Vaughn [[id number]]  female   Baby Name Dawn Vaughn Circumcision plans in-patient circumcision   S:   Reports feeling good Tolerating PO fluid and solids No nausea or vomiting Bleeding is moderate, no clots Pain controlled with  PO meds Up ad lib / ambulatory / voiding w/o difficulty Feeding: Breast    O:   VS: BP 132/75 (BP Location: Left Arm)   Pulse 96   Temp 97.6 F (36.4 C)   Resp 16   Ht 5\' 3"  (1.6 m)   Wt 115.2 kg   LMP 08/01/2021   SpO2 98%   BMI 44.99 kg/m   LABS:  Recent Labs    04/28/22 0310 04/28/22 0851  WBC 16.0* 15.7*  HGB 11.2* 10.8*  PLT 181 173   CBG (last 3)  Recent Labs    04/27/22 2349 04/28/22 0645 04/29/22 0637  GLUCAP 90 104* 173*    Blood type: --/--/O POS (05/31 0120) Rubella: Immune (10/24 0000)                      I&O: Intake/Output      06/01 0701 06/02 0700 06/02 0701 06/03 0700   P.O. 2040    I.V. (mL/kg) 726.9 (6.3)    Other     Total Intake(mL/kg) 2766.9 (24)    Urine (mL/kg/hr) 2200 (0.8)    Stool 0    Blood     Total Output 2200    Net +566.9         Urine Occurrence 1 x    Stool Occurrence 0 x      Physical Exam: Alert and oriented X3 Lungs: Clear and unlabored Heart: regular rate and rhythm / no mumurs Abdomen: soft, non-tender, non-distended  Fundus: firm, non-tender, U-1 Perineum: edematous, well-approximated, healing Lochia: appropriate Extremities: trace edema, no calf pain, tenderness, or cords    A:  PPD # 1  Normal exam GHTN w/ SI pre-eclampsia    -S/P Mag    -normotensive to mild range BP A2DM    -continue Metformin P:  Routine post partum orders Pre-e labs repeated this AM Anticipate D/C on 04/30/22  Plan reviewed w/ Dr. 06/30/22 and Dr. Mora Appl, MSN, CNM 04/29/2022, 7:52 AM

## 2022-04-29 NOTE — Plan of Care (Signed)
  Problem: Education: Goal: Knowledge of condition will improve Outcome: Progressing Goal: Individualized Educational Video(s) Outcome: Progressing Goal: Individualized Newborn Educational Video(s) Outcome: Progressing   Problem: Activity: Goal: Will verbalize the importance of balancing activity with adequate rest periods Outcome: Progressing Goal: Ability to tolerate increased activity will improve Outcome: Progressing   Problem: Coping: Goal: Ability to identify and utilize available resources and services will improve Outcome: Progressing   Problem: Life Cycle: Goal: Chance of risk for complications during the postpartum period will decrease Outcome: Progressing   Problem: Role Relationship: Goal: Ability to demonstrate positive interaction with newborn will improve Outcome: Progressing   Problem: Skin Integrity: Goal: Demonstration of wound healing without infection will improve Outcome: Progressing   Problem: Education: Goal: Knowledge of General Education information will improve Description: Including pain rating scale, medication(s)/side effects and non-pharmacologic comfort measures Outcome: Progressing   Problem: Health Behavior/Discharge Planning: Goal: Ability to manage health-related needs will improve Outcome: Progressing   Problem: Clinical Measurements: Goal: Ability to maintain clinical measurements within normal limits will improve Outcome: Progressing Goal: Will remain free from infection Outcome: Progressing Goal: Diagnostic test results will improve Outcome: Progressing Goal: Respiratory complications will improve Outcome: Progressing Goal: Cardiovascular complication will be avoided Outcome: Progressing   Problem: Activity: Goal: Risk for activity intolerance will decrease Outcome: Progressing   Problem: Nutrition: Goal: Adequate nutrition will be maintained Outcome: Progressing   Problem: Coping: Goal: Level of anxiety will  decrease Outcome: Progressing   Problem: Elimination: Goal: Will not experience complications related to bowel motility Outcome: Progressing Goal: Will not experience complications related to urinary retention Outcome: Progressing   Problem: Pain Managment: Goal: General experience of comfort will improve Outcome: Progressing   Problem: Safety: Goal: Ability to remain free from injury will improve Outcome: Progressing   Problem: Skin Integrity: Goal: Risk for impaired skin integrity will decrease Outcome: Progressing   

## 2022-04-29 NOTE — Lactation Note (Signed)
This note was copied from a baby's chart. Lactation Consultation Note  Patient Name: Dawn Vaughn [id number] Date: 04/29/2022 Reason for consult: Mother's request;Follow-up assessment;Difficult latch;1st time breastfeeding Age:39 hours Mom requested LC services to assist with latching infant at the breast. Mom latched infant using 16 mm NS, infant latched on mom's right breast using the football hold position, infant sustained latch and breastfeed for 13 minutes. Mom will continue to breastfeed infant according to hunger cues, on demand, 8 to 12+ or more times within 24 hours, skin to skin. Mom knows to call RN/LC for further latch assistance if needed. Mom will attempt to latch infant at the breast for every feeding 1st before supplementing infant with formula.  Afterwards infant was supplemented with 15 mls of formula with yellow slow flow bottle nipple. Mom was using the DEBP as LC left  the room, mom will continue to pump every 3 hours for 15 minutes on initial setting. Maternal Data    Feeding Mother's Current Feeding Choice: Breast Milk and Formula Nipple Type: Slow - flow  LATCH Score Latch: Grasps breast easily, tongue down, lips flanged, rhythmical sucking.  Audible Swallowing: Spontaneous and intermittent  Type of Nipple: Flat (short shafted)  Comfort (Breast/Nipple): Soft / non-tender  Hold (Positioning): Assistance needed to correctly position infant at breast and maintain latch.  LATCH Score: 8   Lactation Tools Discussed/Used Tools: Pump Nipple shield size: 16 (Mom is short shafted) Flange Size: 21 Breast pump type: Double-Electric Breast Pump Reason for Pumping: Infant was poor feeding, ETI , mom on MAgso4  Interventions Interventions: Breast compression;Adjust position;Support pillows;Position options;Education;Breast massage;Skin to skin  Discharge    Consult Status Consult Status: Follow-up Date: 04/29/22 Follow-up type: In-patient    Danelle Earthly 04/29/2022, 1:16 AM

## 2022-04-29 NOTE — Lactation Note (Signed)
This note was copied from a baby's chart. Lactation Consultation Note  Patient Name: Dawn Vaughn [id number] Date: 04/29/2022 Reason for consult: Follow-up assessment;Difficult latch;Early term 37-38.6wks;Maternal endocrine disorder;Other (Comment) (CHTN) Age:39 hours  Mom complaining of nipple pain with 21 flange and areola pain with 24. LC provided coconut oil to line flange before pumping with 21 flange on lower setting.  Infant not able to sustain the latch with use of 16 NS primed with formula. Mom bottle fed infant 31 ml at the end of the [facility] visit.  Mom encouraged to pump for stimulation now she is feeling better off the Mg Sulfate.   Plan 1 To feed based on cues 8-12x 24hr period. Mom to offer breasts and if needed try the 16 NS 2. Mom to supplement offering any EBM first followed by formula pace bottle feeding with extra slow flow nipple.  3. Post pumping after latching on initial setting for 15 min.  All questions answered at the end of the visit.     Maternal Data Has patient been taught Hand Expression?: Yes  Feeding Mother's Current Feeding Choice: Breast Milk and Formula Nipple Type: Extra Slow Flow  LATCH Score Latch: Repeated attempts needed to sustain latch, nipple held in mouth throughout feeding, stimulation needed to elicit sucking reflex.  Audible Swallowing: None  Type of Nipple: Flat (Mom nipples are small but will erect with stimulation)  Comfort (Breast/Nipple): Soft / non-tender  Hold (Positioning): Assistance needed to correctly position infant at breast and maintain latch.  LATCH Score: 5   Lactation Tools Discussed/Used Tools: Coconut oil;Flanges;Pump;Nipple Shields Nipple shield size: 16 (LC attempted to latch with help of NS primed with formula, infant did few sucks but not able to sustain it.) Flange Size: 21 Breast pump type: Double-Electric Breast Pump Pump Education: Setup, frequency, and cleaning;Milk Storage Reason for Pumping:  increase stimulation Pumping frequency: pumping after latching for 15 min  Interventions Interventions: Breast feeding basics reviewed;Assisted with latch;Skin to skin;Breast massage;Hand express;Pre-pump if needed;Breast compression;Adjust position;Support pillows;Position options;Expressed milk;Coconut oil;DEBP;Education;Pace feeding;LC Psychologist, educational;Infant Driven Feeding Algorithm education  Discharge Pump: Personal;DEBP  Consult Status Consult Status: Follow-up Date: 04/30/22    Timi Reeser  Nicholson-Springer 04/29/2022, 1:02 PM

## 2022-04-30 LAB — GLUCOSE, CAPILLARY
Glucose-Capillary: 114 mg/dL — ABNORMAL HIGH (ref 70–99)
Glucose-Capillary: 100 mg/dL — ABNORMAL HIGH (ref 70–99)
Glucose-Capillary: 98 mg/dL (ref 70–99)
Glucose-Capillary: 144 mg/dL — ABNORMAL HIGH (ref 70–99)

## 2022-04-30 MED ORDER — OXYCODONE-ACETAMINOPHEN 5-325 MG PO TABS: ORAL_TABLET | ORAL | 0 refills | Status: DC

## 2022-04-30 MED ORDER — NIFEDIPINE ER 60 MG PO TB24: 60.0000 mg | ORAL_TABLET | Freq: Every day | ORAL | 3 refills | Status: DC

## 2022-04-30 MED ORDER — ACETAMINOPHEN 325 MG PO TABS: 650.0000 mg | ORAL_TABLET | ORAL | 3 refills | Status: AC | PRN

## 2022-04-30 MED ORDER — BENZOCAINE-MENTHOL 20-0.5 % EX AERO
1.0000 | INHALATION_SPRAY | CUTANEOUS | 3 refills | Status: DC | PRN
Start: 2022-04-30 — End: 2022-09-20

## 2022-04-30 MED ORDER — IBUPROFEN 600 MG PO TABS: 600.0000 mg | ORAL_TABLET | Freq: Four times a day (QID) | ORAL | 3 refills | Status: DC | PRN

## 2022-04-30 NOTE — Progress Notes (Signed)
Pt given AVS discharge instructions. All medication and follow-up appointments reviewed and all questions answered. Vitals WNL. Belonging's at bedside. Pt to stay rooming-in with baby pt.

## 2022-04-30 NOTE — Discharge Summary (Signed)
[facility] Discharge Summary   Patient Name:   Dawn Vaughn DOB:     08-12-1981 MRN:     [id number]  Date of Admission:   04/27/2022 Date of Discharge:  04/30/2022  Admitting diagnosis:    IUGR (intrauterine growth restriction) affecting care of mother [O36.5990] Principal Problem:   Antiphospholipid syndrome complicating pregnancy, antepartum ([place]) Active Problems:   Sickle cell trait (HCC)   Chronic hypertension with superimposed pre-eclampsia   Gestational diabetes mellitus, class A2   Maternal obesity syndrome, antepartum, third trimester   AMA (advanced maternal age) multigravida 35+   H/O intrauterine fetal death, currently pregnant   Hx of trichomoniasis   HSV-2 infection     Discharge diagnosis:    IUGR (intrauterine growth restriction) affecting care of mother [O36.5990] Principal Problem:   Antiphospholipid syndrome complicating pregnancy, antepartum ([place]) Active Problems:   Sickle cell trait ([place])   Chronic hypertension with superimposed pre-eclampsia   Gestational diabetes mellitus, class A2   Maternal obesity syndrome, antepartum, third trimester   AMA (advanced maternal age) multigravida 35+   H/O intrauterine fetal death, currently pregnant   Hx of trichomoniasis   HSV-2 infection  Term Pregnancy Delivered and CHTN with superimposed preeclampsia        Additional problems: None                                          Post partum procedures: none Augmentation: AROM, Pitocin, and Cytotec Complications: None  Hospital course: Induction of Labor With Vaginal Delivery   41 y.o. yo G2P0100 at 54w6dwas admitted to the hospital 04/27/2022 for induction of labor.  Indication for induction: Gestational hypertension and A2 DM.  Patient had an uncomplicated labor course as follows: Membrane Rupture Time/Date: 2:45 PM ,04/27/2022   Delivery Method:Vaginal, Spontaneous  Episiotomy: None  Lacerations:  1st  degree;Perineal;Vaginal;Periurethral;Labial  Details of delivery can be found in separate delivery note.  Patient had a routine postpartum course. Patient is discharged home 05/03/22.  Newborn Data: Birth date:04/28/2022  Birth time:1:18 AM  Gender:Female  Living status:Living  Apgars:8 ,9  Weight:2750 g   Magnesium Sulfate received: Yes: Seizure prophylaxis BMZ received: No Rhophylac:No MMR:No T-DaP:Given postpartum Flu: No Transfusion:No                                                               Type of Delivery:  NSVD Delivering Provider: REverett Graff Date of Delivery:  04/28/22  Newborn Data:  Baby Feeding:   Bottle and Breast Disposition:   home with mother  Physical Exam:   Vitals:   04/30/22 0326 04/30/22 0805 04/30/22 1313 04/30/22 1706  BP: 131/60 113/62 134/83 137/74  Pulse: 95 92 (!) 105 99  Resp: 18 17 18 17   Temp: 98.6 F (37 C) 98.3 F (36.8 C) 98.1 F (36.7 C) 98 F (36.7 C)  TempSrc: Oral Oral Oral Oral  SpO2: 96% 98% 94% 99%  Weight:      Height:       General: alert, cooperative, and no distress Lochia: appropriate Uterine Fundus: firm Incision: N/A DVT Evaluation: No evidence of DVT seen on physical exam. Negative Homan's sign. No cords  or calf tenderness.  Labs: Lab Results  Component Value Date   WBC 12.1 (H) 04/29/2022   HGB 10.2 (L) 04/29/2022   HCT 31.6 (L) 04/29/2022   MCV 75.1 (L) 04/29/2022   PLT 168 04/29/2022      Latest Ref Rng & Units 04/29/2022    7:33 AM  CMP  Glucose 70 - 99 mg/dL 153    BUN 6 - 20 mg/dL 7    Creatinine 0.44 - 1.00 mg/dL 0.70    Sodium 135 - 145 mmol/L 137    Potassium 3.5 - 5.1 mmol/L 3.8    Chloride 98 - 111 mmol/L 108    CO2 22 - 32 mmol/L 22    Calcium 8.9 - 10.3 mg/dL 7.5    Total Protein 6.5 - 8.1 g/dL 5.3    Total Bilirubin 0.3 - 1.2 mg/dL 0.3    Alkaline Phos 38 - 126 U/L 92    AST 15 - 41 U/L 22    ALT 0 - 44 U/L 16      Discharge instruction: per After Visit Summary and "Baby  and Me Booklet".  After Visit Meds:  Allergies as of 04/30/2022   No Known Allergies      Medication List     STOP taking these medications    aspirin EC 81 MG tablet   metFORMIN 1000 MG tablet Commonly known as: GLUCOPHAGE   valACYclovir 500 MG tablet Commonly known as: Valtrex       TAKE these medications    acetaminophen 325 MG tablet Commonly known as: Tylenol Take 2 tablets (650 mg total) by mouth every 4 (four) hours as needed (for pain scale < 4).   Alka-Seltzer Heartburn 750 MG chewable tablet Generic drug: calcium carbonate Chew 375 mg by mouth daily as needed for heartburn.   benzocaine-Menthol 20-0.5 % Aero Commonly known as: DERMOPLAST Apply 1 application. topically as needed for irritation (perineal discomfort).   ibuprofen 600 MG tablet Commonly known as: ADVIL Take 1 tablet (600 mg total) by mouth every 6 (six) hours as needed for cramping.   NIFEdipine 60 MG 24 hr tablet Commonly known as: ADALAT CC Take 1 tablet (60 mg total) by mouth daily. Start taking on: May 01, 2022   oxyCODONE-acetaminophen 5-325 MG tablet Commonly known as: PERCOCET/ROXICET 1 to 2 tabs every 4 to 6 hours as needed for severe pain greater than level 6 Please DO NOT combine acetaminophen products as max tylenol dose in 2400 grams In a 24 hour period   PRENATAL VITAMIN PO Take 1 tablet by mouth daily with lunch.        Diet: routine diet  Activity: Advance as tolerated. Pelvic rest for 6 weeks.   Outpatient follow up:1 week Follow up Appt:No future appointments. Follow up visit: No follow-ups on file.  Postpartum contraception: Not Discussed  04/30/2022 Sanjuana Kava, MD

## 2022-04-30 NOTE — Lactation Note (Addendum)
This note was copied from a baby's chart. Lactation Consultation Note  Patient Name: Dawn Vaughn [id number] Date: 04/30/2022 Reason for consult: Follow-up assessment;Mother's request;Difficult latch;Early term 37-38.6wks;Infant weight loss;Breastfeeding assistance;Maternal endocrine disorder Age:39 hours   Mom feeding by cues and we attempted latch without NS, infant did a few sucks in cross cradle prone but did not sustain it.  Mom feeding by cues 8-12x 24hr period. If unable to latch, Mom bottle feeding offer EBM first followed by formula with pace bottle feeding with extra slow flow nipple. Mom offering 30 ml or more per feeding advancing as tolerated.  LC adjusted flange size to 21 and 24. Mom encouraged to post pump after feeding for 15 min on initial setting.  Infant adequate urine and stool output.   Mom Evenflo pump at home reaching out to insurance to get a hospital grade pump.   Maternal Data Has patient been taught Hand Expression?: Yes  Feeding Mother's Current Feeding Choice: Breast Milk and Formula Nipple Type: Extra Slow Flow  LATCH Score Latch: Repeated attempts needed to sustain latch, nipple held in mouth throughout feeding, stimulation needed to elicit sucking reflex.  Audible Swallowing: None  Type of Nipple: Everted at rest and after stimulation  Comfort (Breast/Nipple): Soft / non-tender  Hold (Positioning): Assistance needed to correctly position infant at breast and maintain latch.  LATCH Score: 6   Lactation Tools Discussed/Used Tools: Coconut oil;Flanges;Pump;Nipple Shields Nipple shield size: 16 Flange Size: 21;24 Breast pump type: Double-Electric Breast Pump Pump Education: Setup, frequency, and cleaning;Milk Storage Reason for Pumping: increase stimulation Pumping frequency: pump after feeding on inital setting for 15 min  Interventions Interventions: Breast feeding basics reviewed;Assisted with latch;Skin to skin;Breast massage;Hand  express;Breast compression;Adjust position;Support pillows;Position options;Expressed milk;Coconut oil;DEBP;Education;Pace feeding;Infant Driven Feeding Algorithm education;LPT handout/interventions;LC Services brochure  Discharge Discharge Education: Engorgement and breast care;Warning signs for feeding baby;Outpatient recommendation;Outpatient Epic message sent Pump: DEBP;Personal  Consult Status Consult Status: Complete Date: 04/30/22    Natavia Sublette  Nicholson-Springer 04/30/2022, 11:56 AM

## 2022-05-09 ENCOUNTER — Telehealth (HOSPITAL_COMMUNITY): Payer: Self-pay | Admitting: *Deleted

## 2022-05-09 NOTE — Telephone Encounter (Signed)
Mom reports feeling good. No concerns about herself at this time. EPDS=2 [facility] score=4) Mom reports baby is doing well. Feeding, peeing, and pooping without difficulty. Safe sleep reviewed. Mom reports no concerns about baby at present.  Duffy Rhody, RN 05-09-2022 at 1:06pm

## 2022-07-13 ENCOUNTER — Encounter (HOSPITAL_BASED_OUTPATIENT_CLINIC_OR_DEPARTMENT_OTHER): Payer: Self-pay | Admitting: Emergency Medicine

## 2022-07-13 ENCOUNTER — Emergency Department (HOSPITAL_BASED_OUTPATIENT_CLINIC_OR_DEPARTMENT_OTHER)
Admission: EM | Admit: 2022-07-13 | Discharge: 2022-07-13 | Disposition: A | Discharge status: Home / Self Care | Payer: BC Managed Care – PPO | Attending: Emergency Medicine | Admitting: Emergency Medicine

## 2022-07-13 ENCOUNTER — Emergency Department (HOSPITAL_BASED_OUTPATIENT_CLINIC_OR_DEPARTMENT_OTHER): Payer: BC Managed Care – PPO

## 2022-07-13 DIAGNOSIS — S42401A Unspecified fracture of lower end of right humerus, initial encounter for closed fracture: Secondary | ICD-10-CM | POA: Diagnosis not present

## 2022-07-13 DIAGNOSIS — X58XXXA Exposure to other specified factors, initial encounter: Secondary | ICD-10-CM | POA: Insufficient documentation

## 2022-07-13 DIAGNOSIS — M25521 Pain in right elbow: Secondary | ICD-10-CM | POA: Insufficient documentation

## 2022-07-13 DIAGNOSIS — S59901A Unspecified injury of right elbow, initial encounter: Secondary | ICD-10-CM | POA: Diagnosis present

## 2022-07-13 HISTORY — DX: Unspecified maternal hypertension, unspecified trimester: O16.9

## 2022-07-13 HISTORY — DX: Gestational diabetes mellitus in pregnancy, unspecified control: O24.419

## 2022-07-13 MED ORDER — NAPROXEN 500 MG PO TABS
500.0000 mg | ORAL_TABLET | Freq: Two times a day (BID) | ORAL | 0 refills | Status: DC
Start: 2022-07-13 — End: 2022-07-13

## 2022-07-13 MED ORDER — NAPROXEN 250 MG PO TABS
500.0000 mg | ORAL_TABLET | Freq: Once | ORAL | Status: AC
  Administered 2022-07-13: 500 mg via ORAL
  Filled 2022-07-13: qty 2

## 2022-07-13 MED ORDER — NAPROXEN 500 MG PO TABS: 500.0000 mg | ORAL_TABLET | Freq: Two times a day (BID) | ORAL | 0 refills | Status: DC

## 2022-07-13 NOTE — ED Triage Notes (Addendum)
Pt reports pain to RT elbow since giving birth 2 mo ago; difficult to move today

## 2022-07-13 NOTE — Discharge Instructions (Addendum)
It was a pleasure taking care of you today.  As discussed, your elbow is broken.  I am sending you home with pain medication.  Take as needed.  I have included the number of the orthopedic surgeon.  Please call tomorrow to schedule an appointment for further evaluation.  You may use a sling as needed for pain.  Return to the ER for new or worsening symptoms.

## 2022-07-13 NOTE — ED Provider Notes (Signed)
[facility] EMERGENCY DEPARTMENT Provider Note   CSN: [id number] Arrival date & time: 07/13/22  1144     History  Chief Complaint  Patient presents with   Elbow Pain    Dawn Vaughn is a 41 y.o. female with a past medical history significant for sickle cell trait who presents to the ED due to persistent right elbow pain x1 month. Patient notes elbow pain has been persistent ever since giving birth. No known injury. Denies edema, erythema, and warmth. She is right hand dominant. Denies numbness/tingling. Pain is worse with movement.   History obtained from patient and past medical records. No interpreter used during encounter.       Home Medications Prior to Admission medications   Medication Sig Start Date End Date Taking? Authorizing Provider  acetaminophen (TYLENOL) 325 MG tablet Take 2 tablets (650 mg total) by mouth every 4 (four) hours as needed (for pain scale < 4). 04/30/22   Essie Hart, MD  benzocaine-Menthol (DERMOPLAST) 20-0.5 % AERO Apply 1 application. topically as needed for irritation (perineal discomfort). 04/30/22   Essie Hart, MD  calcium carbonate (ALKA-SELTZER HEARTBURN) 750 MG chewable tablet Chew 375 mg by mouth daily as needed for heartburn.    [provider]  ibuprofen (ADVIL) 600 MG tablet Take 1 tablet (600 mg total) by mouth every 6 (six) hours as needed for cramping. 04/30/22   Essie Hart, MD  NIFEdipine (ADALAT CC) 60 MG 24 hr tablet Take 1 tablet (60 mg total) by mouth daily. 05/01/22   Essie Hart, MD  oxyCODONE-acetaminophen (PERCOCET/ROXICET) 5-325 MG tablet 1 to 2 tabs every 4 to 6 hours as needed for severe pain greater than level 6 Please DO NOT combine acetaminophen products as max tylenol dose in 2400 grams In a 24 hour period 04/30/22   Essie Hart, MD  Prenatal Vit-Fe Fumarate-FA (PRENATAL VITAMIN PO) Take 1 tablet by mouth daily with lunch.    [provider]      Allergies    Patient has no known allergies.     Review of Systems   Review of Systems  Constitutional:  Negative for chills and fever.  Musculoskeletal:  Positive for arthralgias. Negative for joint swelling.    Physical Exam Updated Vital Signs BP (!) 149/112 (BP Location: Right Arm)   Pulse 88   Temp 98.4 F (36.9 C) (Oral)   Resp 18   Ht 5\' 3"  (1.6 m)   Wt 107 kg   LMP 08/01/2021   SpO2 100%   Breastfeeding Yes   BMI 41.81 kg/m  Physical Exam Vitals and nursing note reviewed.  Constitutional:      General: She is not in acute distress.    Appearance: She is not ill-appearing.  HENT:     Head: Normocephalic.  Eyes:     Pupils: Pupils are equal, round, and reactive to light.  Cardiovascular:     Rate and Rhythm: Normal rate and regular rhythm.     Pulses: Normal pulses.     Heart sounds: Normal heart sounds. No murmur heard.    No friction rub. No gallop.  Pulmonary:     Effort: Pulmonary effort is normal.     Breath sounds: Normal breath sounds.  Abdominal:     General: Abdomen is flat. There is no distension.     Palpations: Abdomen is soft.     Tenderness: There is no abdominal tenderness. There is no guarding or rebound.  Musculoskeletal:  General: Normal range of motion.     Cervical back: Neck supple.     Comments: Tenderness throughout right elbow without edema, erythema, or warmth. Radial pulse intact. Soft compartments. No tenderness throughout right shoulder or wrist.   Skin:    General: Skin is warm and dry.  Neurological:     General: No focal deficit present.     Mental Status: She is alert.  Psychiatric:        Mood and Affect: Mood normal.        Behavior: Behavior normal.     ED Results / Procedures / Treatments   Labs (all labs ordered are listed, but only abnormal results are displayed) Labs Reviewed - No data to display  EKG None  Radiology DG Elbow Complete Right  Result Date: 07/13/2022 CLINICAL DATA:  Pain with straightening elbow x2 months. EXAM: RIGHT ELBOW -  COMPLETE 3+ VIEW COMPARISON:  None Available. FINDINGS: Subacute/chronic fracture of the sublime tubercle. There is no evidence of arthropathy. Soft tissues are unremarkable. IMPRESSION: Subacute/chronic fracture of the sublime tubercle. Electronically Signed   By: Maudry Mayhew M.D.   On: 07/13/2022 13:14    Procedures Procedures    Medications Ordered in ED Medications  naproxen (NAPROSYN) tablet 500 mg (has no administration in time range)    ED Course/ Medical Decision Making/ A&P                           Medical Decision Making Amount and/or Complexity of Data Reviewed Radiology: ordered and independent interpretation performed. Decision-making details documented in ED Course.  Risk OTC drugs. Prescription drug management.   40 year old female presents to the ED due to persistent right elbow pain that has been present ever since giving birth 2 months ago. Patient is right hand dominant. No fever/chills. Denies edema, erythema, or warmth. No known injury. Upon arrival, stable vitals. Patient in no acute distress. Mild tenderness throughout right elbow without erythema, edema, or warmth. Doubt septic joint. X-ray ordered at triage which I have personally reviewed and interpreted which demonstrates a subacute/chronic fracture. Patient given naproxen here in the ED and placed in sling. Orthopedics number given to patient at discharge and advised to call to schedule an appointment for further evaluation. Strict ED precautions discussed with patient. Patient states understanding and agrees to plan. Patient discharged home in no acute distress and stable vitals         Final Clinical Impression(s) / ED Diagnoses Final diagnoses:  Closed fracture of right elbow, initial encounter    Rx / DC Orders ED Discharge Orders     None         Mannie Stabile, PA-C 07/13/22 1612    Melene Plan, DO 07/13/22 1921

## 2022-07-27 ENCOUNTER — Ambulatory Visit (INDEPENDENT_AMBULATORY_CARE_PROVIDER_SITE_OTHER): Payer: BC Managed Care – PPO

## 2022-07-27 ENCOUNTER — Ambulatory Visit: Payer: BC Managed Care – PPO | Admitting: Orthopedic Surgery

## 2022-07-27 ENCOUNTER — Encounter: Payer: Self-pay | Admitting: Orthopedic Surgery

## 2022-07-27 DIAGNOSIS — M25521 Pain in right elbow: Secondary | ICD-10-CM | POA: Diagnosis not present

## 2022-07-27 NOTE — Progress Notes (Signed)
Office Visit Note   Patient: Dawn Vaughn           Date of Birth: 1981-07-21           MRN: [id number] Visit Date: 07/27/2022 Requested by: No referring provider defined for this encounter. PCP: Patient, No Pcp Per  Subjective: Chief Complaint  Patient presents with   Right Elbow - Pain    HPI: Dawn Vaughn is a 41 year old patient with subacute onset right elbow pain that has been going on for about 3 months.  Does not recall any specific injury.  Describes sharp shooting pain.  Almost all of her pain is on the lateral aspect of the elbow.  Does not wake her from sleep at night.  Takes naproxen without much relief.  She has a 32-month-old noted newborn exam that is putting a significant amount of demand on the right elbow.  Denies any numbness and tingling or neck pain in that right arm.              ROS: All systems reviewed are negative as they relate to the chief complaint within the history of present illness.  Patient denies  fevers or chills.   Assessment & Plan: Visit Diagnoses:  1. Pain in right elbow     Plan: Impression is right elbow pain with definite mechanical symptoms on exam today.  She may have something going on in the joint based on the sharp shooting nature of the pain.  Has some symptoms also consistent with tendinosis of that common extensor origin.  Surprisingly no medial symptoms where her sublime tubercle fracture is.  The fracture remains nondisplaced on radiographs today.  Plan MRI scan to evaluate both that fracture as well as possible osteochondral injury on that lateral radiocapitellar joint.  Follow-up after that study.  Symptoms ongoing now for 3 months with mechanical symptoms present as well as failure of conservative treatment.  Follow-Up Instructions: Return for after MRI.   Orders:  Orders Placed This Encounter  Procedures   XR Elbow Complete Right (3+View)   MR Elbow Right w/o contrast   No orders of the defined types were placed in this  encounter.     Procedures: No procedures performed   Clinical Data: No additional findings.  Objective: Vital Signs: There were no vitals taken for this visit.  Physical Exam:   Constitutional: Patient appears well-developed HEENT:  Head: Normocephalic Eyes:EOM are normal Neck: Normal range of motion Cardiovascular: Normal rate Pulmonary/chest: Effort normal Neurologic: Patient is alert Skin: Skin is warm Psychiatric: Patient has normal mood and affect   Ortho Exam: Ortho exam demonstrates full symmetric passive range of motion in flexion extension with both arms.  Grip strength intact.  Radial pulse intact bilaterally.  No masses lymphadenopathy or skin changes noted in that elbow region.  Did have some significant popping and crepitus with pronation and supination on the right arm x1 during the exam.  No medial sided tenderness is present.  Does have pain localizing to the lateral epicondyle with resisted finger extension and wrist extension.  Specialty Comments:  No specialty comments available.  Imaging: XR Elbow Complete Right (3+View)  Result Date: 07/27/2022 AP lateral radial head radiographic views of the right elbow reviewed.  Ossicle versus avulsion fracture present off the sublime tubercle.  Radiocapitellar joint intact.  She elbow is located.    PMFS History: Patient Active Problem List   Diagnosis Date Noted   Hx of trichomoniasis 04/27/2022   HSV-2 infection 04/27/2022  Chronic hypertension with superimposed pre-eclampsia 2022/04/18   Gestational diabetes mellitus, class A2 04-18-22   Maternal obesity syndrome, antepartum, third trimester 04-18-2022   Antiphospholipid syndrome complicating pregnancy, antepartum (HCC) 04/18/2022   AMA (advanced maternal age) multigravida 35+ 18-Apr-2022   H/O intrauterine fetal death, currently pregnant 04-18-2022   Sickle cell trait (HCC) 12/15/2021   Past Medical History:  Diagnosis Date   Anemia    Diabetes  mellitus without complication (HCC)    Gestational diabetes    Hypertension during pregnancy    Sickle cell trait (HCC)     Family History  Problem Relation Age of Onset   Obesity Mother    Kidney disease Mother    Hypertension Mother    Diabetes Mother    Heart disease Mother    Healthy Father    Early death Son    Cancer Maternal Grandfather     Past Surgical History:  Procedure Laterality Date   CERVICAL CERCLAGE Bilateral 11/19/2021   Procedure: CERCLAGE CERVICAL;  Surgeon: Osborn Coho, MD;  Location: [facility] LD ORS;  Service: Gynecology;  Laterality: Bilateral;   DILATION AND CURETTAGE OF UTERUS     Social History   Occupational History   Not on file  Tobacco Use   Smoking status: Never   Smokeless tobacco: Never  Vaping Use   Vaping Use: Never used  Substance and Sexual Activity   Alcohol use: Not Currently   Drug use: Never   Sexual activity: Not Currently

## 2022-08-13 ENCOUNTER — Ambulatory Visit
Admission: RE | Admit: 2022-08-13 | Discharge: 2022-08-13 | Disposition: A | Discharge status: Home / Self Care | Payer: BC Managed Care – PPO | Source: Ambulatory Visit | Attending: Orthopedic Surgery | Admitting: Orthopedic Surgery

## 2022-08-13 DIAGNOSIS — M25521 Pain in right elbow: Secondary | ICD-10-CM

## 2022-08-19 ENCOUNTER — Ambulatory Visit: Payer: BC Managed Care – PPO | Admitting: Orthopedic Surgery

## 2022-08-19 DIAGNOSIS — M25521 Pain in right elbow: Secondary | ICD-10-CM | POA: Diagnosis not present

## 2022-08-20 ENCOUNTER — Encounter: Payer: Self-pay | Admitting: Orthopedic Surgery

## 2022-08-20 LAB — VITAMIN D 25 HYDROXY (VIT D DEFICIENCY, FRACTURES): Vit D, 25-Hydroxy: 22 ng/mL — ABNORMAL LOW (ref 30–100)

## 2022-08-20 MED ORDER — VITAMIN D3 25 MCG PO TABS: 5000.0000 [IU] | ORAL_TABLET | Freq: Every day | ORAL | Status: DC

## 2022-08-20 NOTE — Progress Notes (Signed)
Office Visit Note   Patient: Dawn Vaughn           Date of Birth: 27-Aug-1981           MRN: [id number] Visit Date: 08/19/2022 Requested by: No referring provider defined for this encounter. PCP: Patient, No Pcp Per  Subjective: Chief Complaint  Patient presents with   Other     Scan review    HPI: Patient presents for follow-up of right elbow pain.  Since she was last seen she has had an MRI scan.  Continues to report pain particularly when she is lifting her newborn child.  MRI scan does show stress reaction and the linear density consistent with nondisplaced stress fracture in the proximal ulna.  Intra-articular pathology is absent in the joint.  Denies any motor or sensory abnormalities in the hand.              ROS: All systems reviewed are negative as they relate to the chief complaint within the history of present illness.  Patient denies  fevers or chills.   Assessment & Plan: Visit Diagnoses:  1. Pain in right elbow     Plan: Impression is stress fracture proximal humerus seen on the MRI scan.  This matches her symptoms.  Plan is to check vitamin D level today as it is most likely low for this type of nontraumatic fracture to occur.  We will supplement her vitamin D and see how she is doing in 2 weeks with decision for or against operative fixation at that time. At the time of her dictation her vitamin D level did come back at 22.  We will supplement with vitamin D and see her back in 2 weeks for clinical recheck and decision for or against fixation.  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  Vitamin D 5000 units daily for 30 days called in.  Called patient as well to inform of same.  We will see her in 2 weeks  Follow-Up Instructions: No follow-ups on file.   Orders:  Orders Placed This Encounter  Procedures   Vitamin D (25 hydroxy)   No orders of the defined  types were placed in this encounter.     Procedures: No procedures performed   Clinical Data: No additional findings.  Objective: Vital Signs: There were no vitals taken for this visit.  Physical Exam:   Constitutional: Patient appears well-developed HEENT:  Head: Normocephalic Eyes:EOM are normal Neck: Normal range of motion Cardiovascular: Normal rate Pulmonary/chest: Effort normal Neurologic: Patient is alert Skin: Skin is warm Psychiatric: Patient has normal mood and affect   Ortho Exam: Ortho exam demonstrates full active and passive range of motion of the wrist and shoulder on the right.  Elbow has nearly full range of motion but she has mild pain with pronation supination of the arm.  Good grip strength.  Biceps triceps tendons nontender and palpable and intact.  She has mild discrete tenderness in the area of the proximal ulna where the stress fracture is present.  Stress fracture not really visible on plain radiographs but you can see the fracture line on the MRI scan.  Specialty Comments:  No specialty comments available.  Imaging: No results found.   PMFS History: Patient Active Problem List   Diagnosis Date Noted   Hx of trichomoniasis 04/27/2022   HSV-2 infection 04/27/2022  Chronic hypertension with superimposed pre-eclampsia 2022/04/06   Gestational diabetes mellitus, class A2 2022-04-06   Maternal obesity syndrome, antepartum, third trimester Apr 06, 2022   Antiphospholipid syndrome complicating pregnancy, antepartum (HCC) 04-06-2022   AMA (advanced maternal age) multigravida 35+ 04/06/2022   H/O intrauterine fetal death, currently pregnant Apr 06, 2022   Sickle cell trait (HCC) 12/15/2021   Past Medical History:  Diagnosis Date   Anemia    Diabetes mellitus without complication (HCC)    Gestational diabetes    Hypertension during pregnancy    Sickle cell trait (HCC)     Family History  Problem Relation Age of Onset   Obesity Mother    Kidney  disease Mother    Hypertension Mother    Diabetes Mother    Heart disease Mother    Healthy Father    Early death Son    Cancer Maternal Grandfather     Past Surgical History:  Procedure Laterality Date   CERVICAL CERCLAGE Bilateral 11/19/2021   Procedure: CERCLAGE CERVICAL;  Surgeon: Osborn Coho, MD;  Location: [facility];  Service: Gynecology;  Laterality: Bilateral;   DILATION AND CURETTAGE OF UTERUS     Social History   Occupational History   Not on file  Tobacco Use   Smoking status: Never   Smokeless tobacco: Never  Vaping Use   Vaping Use: Never used  Substance and Sexual Activity   Alcohol use: Not Currently   Drug use: Never   Sexual activity: Not Currently

## 2022-08-23 ENCOUNTER — Other Ambulatory Visit: Payer: Self-pay | Admitting: Surgical

## 2022-08-23 ENCOUNTER — Telehealth: Payer: Self-pay | Admitting: Orthopedic Surgery

## 2022-08-23 MED ORDER — VITAMIN D 125 MCG (5000 UT) PO CAPS: 1.0000 | ORAL_CAPSULE | Freq: Every day | ORAL | 0 refills | Status: AC

## 2022-08-23 NOTE — Telephone Encounter (Signed)
Pt called requesting a refill of Vitamin D. Pt states please send to [facility] on fill. Pt phone number is [phone].

## 2022-08-23 NOTE — Telephone Encounter (Signed)
Sent in refill

## 2022-08-23 NOTE — Telephone Encounter (Signed)
Tried calling to advise sent. No answer.  

## 2022-09-12 ENCOUNTER — Encounter: Payer: Self-pay | Admitting: Orthopedic Surgery

## 2022-09-12 ENCOUNTER — Telehealth: Payer: Self-pay | Admitting: Orthopedic Surgery

## 2022-09-12 ENCOUNTER — Ambulatory Visit (INDEPENDENT_AMBULATORY_CARE_PROVIDER_SITE_OTHER): Payer: BC Managed Care – PPO | Admitting: Orthopedic Surgery

## 2022-09-12 DIAGNOSIS — S52091G Other fracture of upper end of right ulna, subsequent encounter for closed fracture with delayed healing: Secondary | ICD-10-CM | POA: Diagnosis not present

## 2022-09-12 NOTE — Progress Notes (Signed)
Office Visit Note   Patient: Dawn Vaughn           Date of Birth: [birth year]           MRN: [id number] Visit Date: 09/12/2022 Requested by: No referring provider defined for this encounter. PCP: Patient, No Pcp Per  Subjective: Chief Complaint  Patient presents with   Right Elbow - Follow-up    HPI: Dawn Vaughn is a 41 y.o. female who presents to the office reporting right elbow pain.  She has a known stress fracture of the proximal ulna.  Overall she has marginal improvement but still has pain with activities of daily living.  She has been having pain for about 3 months prior to her last office visit 07/13/2022.  Currently is breast-feeding.  Is taking over-the-counter vitamin D orally.  She wants to get to the gym and be able to exercise without concern.  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  Denies much in the way of mechanical symptoms..                ROS: All systems reviewed are negative as they relate to the chief complaint within the history of present illness.  Patient denies fevers or chills.  Assessment & Plan: Visit Diagnoses:  1. Other closed fracture of proximal end of right ulna with delayed healing, subsequent encounter     Plan: Impression is stress fracture right elbow which is now about 5 months old with continued symptoms.  Discussed continued operative and nonoperative treatment options.  In general patient would like to be active in terms of getting back to the gym.  I think her best bet for that would be compression plate fixation of that proximal ulnar region.  That should stabilize the fracture enough to allow for unrestricted activity.  The risk and benefits of that are discussed with the patient include not limited to infection or vessel damage incomplete restoration of pain.  Patient understands risk benefits and wishes to proceed.  All questions answered.  I do think plate fixation would be a better  option than intramedullary screw fixation.  Follow-Up Instructions: No follow-ups on file.   Orders:  No orders of the defined types were placed in this encounter.  No orders of the defined types were placed in this encounter.     Procedures: No procedures performed   Clinical Data: No additional findings.  Objective: Vital Signs: There were no vitals taken for this visit.  Physical Exam:  Constitutional: Patient appears well-developed HEENT:  Head: Normocephalic Eyes:EOM are normal Neck: Normal range of motion Cardiovascular: Normal rate Pulmonary/chest: Effort normal Neurologic: Patient is alert Skin: Skin is warm Psychiatric: Patient has normal mood and affect  Ortho Exam: Ortho exam demonstrates some tenderness to palpation around the proximal ulna region.  EPL FPL muscle strength intact.  No masses lymphadenopathy or skin changes noted in that elbow region.  Range of motion is full including flexion extension pronation supination.  Specialty Comments:  No specialty comments available.  Imaging: No results found.   PMFS History: Patient Active Problem List   Diagnosis Date Noted   Hx of trichomoniasis 04/27/2022   HSV-2 infection 04/27/2022   Chronic hypertension with superimposed pre-eclampsia 03/23/2022   Gestational diabetes mellitus, class A2 03/23/2022   Maternal obesity syndrome, antepartum, third trimester 03/23/2022   Antiphospholipid syndrome complicating pregnancy, antepartum (HCC) 03/23/2022   AMA (advanced maternal age) multigravida  35+ 01-Apr-2022   H/O intrauterine fetal death, currently pregnant 2022-04-01   Sickle cell trait ([place]) 12/15/2021   Past Medical History:  Diagnosis Date   Anemia    Diabetes mellitus without complication (HCC)    Gestational diabetes    Hypertension during pregnancy    Sickle cell trait ([place])     Family History  Problem Relation Age of Onset   Obesity Mother    Kidney disease Mother    Hypertension Mother     Diabetes Mother    Heart disease Mother    Healthy Father    Early death Son    Cancer Maternal Grandfather     Past Surgical History:  Procedure Laterality Date   CERVICAL CERCLAGE Bilateral 11/19/2021   Procedure: CERCLAGE CERVICAL;  Surgeon: Everett Graff, MD;  Location: [facility];  Service: Gynecology;  Laterality: Bilateral;   DILATION AND CURETTAGE OF UTERUS     Social History   Occupational History   Not on file  Tobacco Use   Smoking status: Never   Smokeless tobacco: Never  Vaping Use   Vaping Use: Never used  Substance and Sexual Activity   Alcohol use: Not Currently   Drug use: Never   Sexual activity: Not Currently

## 2022-09-12 NOTE — Telephone Encounter (Signed)
Patient is scheduled 09-20-22 at [facility] for right elbow proximal ulna stress fracture fixation.  Post op visit is scheduled for 09-30-22  @8 :45am.  She has questions about working from home and is trying to get an idea of how soon she could do this (day of surgery, day after surgery, couple days after surgery). Patient also inquired about a note for work.   Patient can be reached at [phone]

## 2022-09-14 ENCOUNTER — Encounter: Payer: Self-pay | Admitting: Orthopedic Surgery

## 2022-09-14 NOTE — Telephone Encounter (Signed)
Hi Lauren.  Can you send a letter to [employer] stating that she will be out of work on Wednesday the 25th and Thursday the 26th and could work from home Friday the 27th until November 3 and should be able to return to work the week of November 6.  Thanks

## 2022-09-14 NOTE — Telephone Encounter (Signed)
Note generated. Patient may access via mychart.

## 2022-09-16 ENCOUNTER — Telehealth: Payer: Self-pay

## 2022-09-16 ENCOUNTER — Encounter (HOSPITAL_COMMUNITY): Payer: Self-pay | Admitting: Orthopedic Surgery

## 2022-09-16 ENCOUNTER — Other Ambulatory Visit: Payer: Self-pay

## 2022-09-16 NOTE — Telephone Encounter (Signed)
Mychart note sent to patient about form

## 2022-09-16 NOTE — Progress Notes (Signed)
PCP - No PCP Cardiologist - None EKG -  Chest x-ray -  ECHO - Denies Cardiac Cath - Denies CPAP - Denies Gestational DM ERAS Protcol - Yes COVID TEST- NI  Anesthesia review: No  -------------  SDW INSTRUCTIONS:  Your procedure is scheduled on 09/20/22 Tuesday . Please report to [facility] Main Entrance "A" at  130 P.M., and check in at the Admitting office. Call this number if you have problems the morning of surgery: [phone]   Remember: Do not eat  after midnight the night before your surgery  You may drink clear liquids until 100 Pm the Day of your surgery.   Clear liquids allowed are: Water, Non-Citrus Juices (without pulp), Carbonated Beverages, Clear Tea, Black Coffee Only, and Gatorade   Medications to take morning of surgery with a sip of water include: Tyl if needed  As of today, STOP taking any Aspirin (unless otherwise instructed by your surgeon), Aleve, Naproxen, Ibuprofen, Motrin, Advil, Goody's, BC's, all herbal medications, fish oil, and all vitamins.    The Morning of Surgery Do not wear jewelry, make-up or nail polish. Do not wear lotions, powders, or perfumes, or deodorant Do not bring valuables to the hospital. [facility] is not responsible for any belongings or valuables.  Remember that you must have someone to transport you home after your surgery, and remain with you for 24 hours if you are discharged the same day.  Please bring cases for contacts, glasses, hearing aids, dentures or bridgework because it cannot be worn into surgery.   Patients discharged the day of surgery will not be allowed to drive home.   Please shower the NIGHT BEFORE/MORNING OF SURGERY (use antibacterial soap like DIAL soap if possible). Wear comfortable clothes the morning of surgery. Oral Hygiene is also important to reduce your risk of infection.  Remember - BRUSH YOUR TEETH THE MORNING OF SURGERY WITH YOUR REGULAR TOOTHPASTE  Patient denies shortness of breath, fever,  cough and chest pain.

## 2022-09-19 ENCOUNTER — Telehealth: Payer: Self-pay | Admitting: Orthopedic Surgery

## 2022-09-19 NOTE — Telephone Encounter (Signed)
Patient wants to know if her FMLA papers has been fax to her job. [employer] [place] fax [phone]. Patient phone [phone]

## 2022-09-20 ENCOUNTER — Ambulatory Visit (HOSPITAL_COMMUNITY): Payer: BC Managed Care – PPO

## 2022-09-20 ENCOUNTER — Other Ambulatory Visit: Payer: Self-pay

## 2022-09-20 ENCOUNTER — Ambulatory Visit (HOSPITAL_COMMUNITY): Payer: BC Managed Care – PPO | Admitting: Certified Registered Nurse Anesthetist

## 2022-09-20 ENCOUNTER — Encounter (HOSPITAL_COMMUNITY)
Admission: RE | Disposition: A | Discharge status: Home / Self Care | Payer: Self-pay | Source: Home / Self Care | Attending: Orthopedic Surgery

## 2022-09-20 ENCOUNTER — Ambulatory Visit (HOSPITAL_COMMUNITY)
Admission: RE | Admit: 2022-09-20 | Discharge: 2022-09-20 | Disposition: A | Discharge status: Home / Self Care | Payer: BC Managed Care – PPO | Attending: Orthopedic Surgery | Admitting: Orthopedic Surgery

## 2022-09-20 ENCOUNTER — Encounter (HOSPITAL_COMMUNITY): Payer: Self-pay | Admitting: Orthopedic Surgery

## 2022-09-20 DIAGNOSIS — S52091G Other fracture of upper end of right ulna, subsequent encounter for closed fracture with delayed healing: Secondary | ICD-10-CM

## 2022-09-20 DIAGNOSIS — S52001A Unspecified fracture of upper end of right ulna, initial encounter for closed fracture: Secondary | ICD-10-CM | POA: Insufficient documentation

## 2022-09-20 DIAGNOSIS — D573 Sickle-cell trait: Secondary | ICD-10-CM | POA: Diagnosis not present

## 2022-09-20 DIAGNOSIS — Z01818 Encounter for other preprocedural examination: Secondary | ICD-10-CM

## 2022-09-20 DIAGNOSIS — I10 Essential (primary) hypertension: Secondary | ICD-10-CM | POA: Insufficient documentation

## 2022-09-20 DIAGNOSIS — M25521 Pain in right elbow: Secondary | ICD-10-CM | POA: Diagnosis present

## 2022-09-20 DIAGNOSIS — Z6841 Body Mass Index (BMI) 40.0 and over, adult: Secondary | ICD-10-CM | POA: Insufficient documentation

## 2022-09-20 DIAGNOSIS — S52009A Unspecified fracture of upper end of unspecified ulna, initial encounter for closed fracture: Secondary | ICD-10-CM

## 2022-09-20 DIAGNOSIS — X58XXXA Exposure to other specified factors, initial encounter: Secondary | ICD-10-CM | POA: Diagnosis not present

## 2022-09-20 HISTORY — PX: ORIF ULNAR FRACTURE: SHX5417

## 2022-09-20 LAB — CBC
HCT: 42 % (ref 36.0–46.0)
Hemoglobin: 13.9 g/dL (ref 12.0–15.0)
MCH: 25.2 pg — ABNORMAL LOW (ref 26.0–34.0)
MCHC: 33.1 g/dL (ref 30.0–36.0)
MCV: 76.1 fL — ABNORMAL LOW (ref 80.0–100.0)
Platelets: 287 10*3/uL (ref 150–400)
RBC: 5.52 MIL/uL — ABNORMAL HIGH (ref 3.87–5.11)
RDW: 15.1 % (ref 11.5–15.5)
WBC: 6.7 10*3/uL (ref 4.0–10.5)
nRBC: 0 % (ref 0.0–0.2)

## 2022-09-20 LAB — POCT PREGNANCY, URINE: Preg Test, Ur: NEGATIVE

## 2022-09-20 LAB — GLUCOSE, CAPILLARY: Glucose-Capillary: 103 mg/dL — ABNORMAL HIGH (ref 70–99)

## 2022-09-20 LAB — SURGICAL PCR SCREEN
MRSA, PCR: NEGATIVE
Staphylococcus aureus: NEGATIVE

## 2022-09-20 SURGERY — OPEN REDUCTION INTERNAL FIXATION (ORIF) ULNAR FRACTURE
Anesthesia: General | Site: Elbow | Laterality: Right

## 2022-09-20 MED ORDER — PROPOFOL 500 MG/50ML IV EMUL
INTRAVENOUS | Status: DC | PRN
  Administered 2022-09-20: 50 ug/kg/min via INTRAVENOUS

## 2022-09-20 MED ORDER — OXYCODONE HCL 5 MG PO TABS: 5.0000 mg | ORAL_TABLET | Freq: Once | ORAL | Status: DC | PRN

## 2022-09-20 MED ORDER — HYDROMORPHONE HCL 1 MG/ML IJ SOLN
0.2500 mg | INTRAMUSCULAR | Status: DC | PRN
  Administered 2022-09-20 (×2): 0.5 mg via INTRAVENOUS

## 2022-09-20 MED ORDER — CLONIDINE HCL (ANALGESIA) 100 MCG/ML EP SOLN
EPIDURAL | Status: AC
  Filled 2022-09-20: qty 10

## 2022-09-20 MED ORDER — DEXAMETHASONE SODIUM PHOSPHATE 10 MG/ML IJ SOLN
INTRAMUSCULAR | Status: AC
  Filled 2022-09-20: qty 3

## 2022-09-20 MED ORDER — LIDOCAINE 2% (20 MG/ML) 5 ML SYRINGE
INTRAMUSCULAR | Status: AC
  Filled 2022-09-20: qty 15

## 2022-09-20 MED ORDER — CHLORHEXIDINE GLUCONATE 0.12 % MT SOLN
OROMUCOSAL | Status: AC
  Administered 2022-09-20: 15 mL
  Filled 2022-09-20: qty 15

## 2022-09-20 MED ORDER — OXYCODONE HCL 5 MG/5ML PO SOLN: 5.0000 mg | Freq: Once | ORAL | Status: DC | PRN

## 2022-09-20 MED ORDER — MORPHINE SULFATE (PF) 4 MG/ML IV SOLN
INTRAVENOUS | Status: AC
  Filled 2022-09-20: qty 2

## 2022-09-20 MED ORDER — POVIDONE-IODINE 10 % EX SWAB
2.0000 | Freq: Once | CUTANEOUS | Status: AC
  Administered 2022-09-20: 2 via TOPICAL

## 2022-09-20 MED ORDER — LACTATED RINGERS IV SOLN: INTRAVENOUS | Status: DC

## 2022-09-20 MED ORDER — FENTANYL CITRATE (PF) 250 MCG/5ML IJ SOLN
INTRAMUSCULAR | Status: DC | PRN
  Administered 2022-09-20: 150 ug via INTRAVENOUS
  Administered 2022-09-20: 50 ug via INTRAVENOUS
  Administered 2022-09-20 (×3): 100 ug via INTRAVENOUS

## 2022-09-20 MED ORDER — ROCURONIUM BROMIDE 10 MG/ML (PF) SYRINGE
PREFILLED_SYRINGE | INTRAVENOUS | Status: AC
  Filled 2022-09-20: qty 20

## 2022-09-20 MED ORDER — MEPERIDINE HCL 25 MG/ML IJ SOLN: 6.2500 mg | INTRAMUSCULAR | Status: DC | PRN

## 2022-09-20 MED ORDER — MORPHINE SULFATE 4 MG/ML IJ SOLN
INTRAMUSCULAR | Status: DC | PRN
  Administered 2022-09-20: 33 mL

## 2022-09-20 MED ORDER — MIDAZOLAM HCL 2 MG/2ML IJ SOLN
INTRAMUSCULAR | Status: AC
  Filled 2022-09-20: qty 2

## 2022-09-20 MED ORDER — POVIDONE-IODINE 7.5 % EX SOLN
Freq: Once | CUTANEOUS | Status: AC
  Administered 2022-09-20: 1 via TOPICAL

## 2022-09-20 MED ORDER — DEXAMETHASONE SODIUM PHOSPHATE 10 MG/ML IJ SOLN
INTRAMUSCULAR | Status: DC | PRN
  Administered 2022-09-20: 10 mg via INTRAVENOUS

## 2022-09-20 MED ORDER — OXYCODONE HCL 5 MG PO TABS: 5.0000 mg | ORAL_TABLET | ORAL | 0 refills | Status: AC | PRN

## 2022-09-20 MED ORDER — AMISULPRIDE (ANTIEMETIC) 5 MG/2ML IV SOLN: 10.0000 mg | Freq: Once | INTRAVENOUS | Status: DC | PRN

## 2022-09-20 MED ORDER — VANCOMYCIN HCL 1000 MG IV SOLR
INTRAVENOUS | Status: AC
  Filled 2022-09-20: qty 20

## 2022-09-20 MED ORDER — ONDANSETRON HCL 4 MG/2ML IJ SOLN
INTRAMUSCULAR | Status: DC | PRN
  Administered 2022-09-20: 4 mg via INTRAVENOUS

## 2022-09-20 MED ORDER — VANCOMYCIN HCL 1000 MG IV SOLR
INTRAVENOUS | Status: DC | PRN
  Administered 2022-09-20: 1000 mg via TOPICAL

## 2022-09-20 MED ORDER — PROMETHAZINE HCL 25 MG/ML IJ SOLN: 6.2500 mg | INTRAMUSCULAR | Status: DC | PRN

## 2022-09-20 MED ORDER — TRANEXAMIC ACID-NACL 1000-0.7 MG/100ML-% IV SOLN
1000.0000 mg | INTRAVENOUS | Status: AC
  Administered 2022-09-20: 1000 mg via INTRAVENOUS
  Filled 2022-09-20: qty 100

## 2022-09-20 MED ORDER — LIDOCAINE 2% (20 MG/ML) 5 ML SYRINGE
INTRAMUSCULAR | Status: DC | PRN
  Administered 2022-09-20: 60 mg via INTRAVENOUS

## 2022-09-20 MED ORDER — FENTANYL CITRATE (PF) 250 MCG/5ML IJ SOLN
INTRAMUSCULAR | Status: AC
  Filled 2022-09-20: qty 5

## 2022-09-20 MED ORDER — HYDROMORPHONE HCL 1 MG/ML IJ SOLN
INTRAMUSCULAR | Status: AC
  Filled 2022-09-20: qty 1

## 2022-09-20 MED ORDER — ONDANSETRON HCL 4 MG/2ML IJ SOLN
INTRAMUSCULAR | Status: AC
  Filled 2022-09-20: qty 6

## 2022-09-20 MED ORDER — METHOCARBAMOL 500 MG PO TABS: 500.0000 mg | ORAL_TABLET | Freq: Three times a day (TID) | ORAL | 0 refills | Status: AC | PRN

## 2022-09-20 MED ORDER — CEFAZOLIN SODIUM-DEXTROSE 2-4 GM/100ML-% IV SOLN
2.0000 g | INTRAVENOUS | Status: AC
  Administered 2022-09-20: 2 g via INTRAVENOUS
  Filled 2022-09-20: qty 100

## 2022-09-20 MED ORDER — 0.9 % SODIUM CHLORIDE (POUR BTL) OPTIME
TOPICAL | Status: DC | PRN
  Administered 2022-09-20: 1000 mL

## 2022-09-20 MED ORDER — BUPIVACAINE HCL (PF) 0.25 % IJ SOLN
INTRAMUSCULAR | Status: AC
  Filled 2022-09-20: qty 30

## 2022-09-20 MED ORDER — MIDAZOLAM HCL 2 MG/2ML IJ SOLN
INTRAMUSCULAR | Status: DC | PRN
  Administered 2022-09-20: 2 mg via INTRAVENOUS

## 2022-09-20 MED ORDER — PROPOFOL 10 MG/ML IV BOLUS
INTRAVENOUS | Status: DC | PRN
  Administered 2022-09-20: 200 mg via INTRAVENOUS
  Administered 2022-09-20: 50 mg via INTRAVENOUS

## 2022-09-20 SURGICAL SUPPLY — 80 items
BAG COUNTER SPONGE SURGICOUNT (BAG) ×1 IMPLANT
BIT DRILL 110X2.5XQCK CNCT (BIT) IMPLANT
BIT DRILL 2.5 (BIT) ×1
BIT DRILL 2.7 (BIT) ×1
BIT DRILL 2.7MM (BIT) IMPLANT
BIT DRILL 2.7X100 LONG (BIT) IMPLANT
BIT DRL 110X2.5XQCK CNCT (BIT) ×1
BLADE SURG 10 STRL SS (BLADE) ×1 IMPLANT
BNDG ELASTIC 4X5.8 VLCR STR LF (GAUZE/BANDAGES/DRESSINGS) ×1 IMPLANT
BNDG ESMARK 4X9 LF (GAUZE/BANDAGES/DRESSINGS) IMPLANT
BNDG GAUZE DERMACEA FLUFF 4 (GAUZE/BANDAGES/DRESSINGS) IMPLANT
CORD BIPOLAR FORCEPS 12FT (ELECTRODE) ×1 IMPLANT
COVER SURGICAL LIGHT HANDLE (MISCELLANEOUS) ×1 IMPLANT
CUFF TOURN SGL QUICK 18X4 (TOURNIQUET CUFF) ×1 IMPLANT
CUFF TOURN SGL QUICK 24 (TOURNIQUET CUFF) ×1
CUFF TRNQT CYL 24X4X16.5-23 (TOURNIQUET CUFF) IMPLANT
DRAIN TLS ROUND 10FR (DRAIN) IMPLANT
DRAPE INCISE IOBAN 66X45 STRL (DRAPES) IMPLANT
DRAPE OEC MINIVIEW 54X84 (DRAPES) IMPLANT
DRAPE U-SHAPE 47X51 STRL (DRAPES) ×1 IMPLANT
DRILL BIT 2.7MM (BIT) ×1
DRSG TEGADERM 4X4.75 (GAUZE/BANDAGES/DRESSINGS) IMPLANT
DRSG XEROFORM 1X8 (GAUZE/BANDAGES/DRESSINGS) IMPLANT
DURAPREP 26ML APPLICATOR (WOUND CARE) ×1 IMPLANT
ELECT REM PT RETURN 9FT ADLT (ELECTROSURGICAL) ×1
ELECTRODE REM PT RTRN 9FT ADLT (ELECTROSURGICAL) ×1 IMPLANT
FACESHIELD WRAPAROUND (MASK) IMPLANT
FACESHIELD WRAPAROUND OR TEAM (MASK) ×1 IMPLANT
GAUZE PAD ABD 8X10 STRL (GAUZE/BANDAGES/DRESSINGS) IMPLANT
GAUZE SPONGE 4X4 12PLY STRL (GAUZE/BANDAGES/DRESSINGS) IMPLANT
GAUZE SPONGE 4X4 12PLY STRL LF (GAUZE/BANDAGES/DRESSINGS) IMPLANT
GAUZE XEROFORM 1X8 LF (GAUZE/BANDAGES/DRESSINGS) IMPLANT
GLOVE BIO SURGEON ST LM GN SZ9 (GLOVE) ×1 IMPLANT
GLOVE BIOGEL PI IND STRL 8 (GLOVE) ×1 IMPLANT
GLOVE ECLIPSE 8.0 STRL XLNG CF (GLOVE) ×1 IMPLANT
GOWN STRL REUS W/ TWL LRG LVL3 (GOWN DISPOSABLE) ×2 IMPLANT
GOWN STRL REUS W/ TWL XL LVL3 (GOWN DISPOSABLE) ×1 IMPLANT
GOWN STRL REUS W/TWL LRG LVL3 (GOWN DISPOSABLE) ×2
GOWN STRL REUS W/TWL XL LVL3 (GOWN DISPOSABLE) ×1
KIT BASIN OR (CUSTOM PROCEDURE TRAY) ×1 IMPLANT
KIT TURNOVER KIT B (KITS) ×1 IMPLANT
MANIFOLD NEPTUNE II (INSTRUMENTS) ×1 IMPLANT
NDL 22X1.5 STRL (OR ONLY) (MISCELLANEOUS) IMPLANT
NEEDLE 22X1.5 STRL (OR ONLY) (MISCELLANEOUS) IMPLANT
NS IRRIG 1000ML POUR BTL (IV SOLUTION) ×1 IMPLANT
PACK ORTHO EXTREMITY (CUSTOM PROCEDURE TRAY) ×1 IMPLANT
PAD ARMBOARD 7.5X6 YLW CONV (MISCELLANEOUS) ×2 IMPLANT
PAD CAST 3X4 CTTN HI CHSV (CAST SUPPLIES) ×1 IMPLANT
PAD CAST 4YDX4 CTTN HI CHSV (CAST SUPPLIES) ×1 IMPLANT
PADDING CAST COTTON 3X4 STRL (CAST SUPPLIES)
PADDING CAST COTTON 4X4 STRL (CAST SUPPLIES) ×1
PLATE VERTUBULAR LOCK 6HOLE (Plate) IMPLANT
SCREW CORT 2.5X20X3.5XST SM (Screw) IMPLANT
SCREW CORT S/T 3.5X22 (Screw) IMPLANT
SCREW CORTICAL 3.5 16MM (Screw) IMPLANT
SCREW CORTICAL 3.5 18MM (Screw) IMPLANT
SCREW CORTICAL 3.5X20 (Screw) ×2 IMPLANT
SCREW LOCK 14X3.5X M THRD (Screw) IMPLANT
SCREW LOCKING 3.5X14 (Screw) ×1 IMPLANT
SPONGE T-LAP 4X18 ~~LOC~~+RFID (SPONGE) ×2 IMPLANT
STAPLER VISISTAT 35W (STAPLE) IMPLANT
STRIP CLOSURE SKIN 1/2X4 (GAUZE/BANDAGES/DRESSINGS) ×1 IMPLANT
SUCTION FRAZIER HANDLE 10FR (MISCELLANEOUS)
SUCTION TUBE FRAZIER 10FR DISP (MISCELLANEOUS) ×1 IMPLANT
SUT ETHILON 3 0 PS 1 (SUTURE) IMPLANT
SUT MNCRL AB 3-0 PS2 18 (SUTURE) IMPLANT
SUT PROLENE 3 0 PS 1 (SUTURE) IMPLANT
SUT VIC AB 0 CT1 27 (SUTURE) ×2
SUT VIC AB 0 CT1 27XBRD ANBCTR (SUTURE) IMPLANT
SUT VIC AB 2-0 CT1 27 (SUTURE) ×2
SUT VIC AB 2-0 CT1 TAPERPNT 27 (SUTURE) IMPLANT
SUT VIC AB 2-0 CTB1 (SUTURE) IMPLANT
SUT VIC AB 3-0 X1 27 (SUTURE) IMPLANT
SYR CONTROL 10ML LL (SYRINGE) IMPLANT
SYSTEM CHEST DRAIN TLS 7FR (DRAIN) IMPLANT
TOWEL GREEN STERILE (TOWEL DISPOSABLE) ×1 IMPLANT
TOWEL GREEN STERILE FF (TOWEL DISPOSABLE) ×1 IMPLANT
TUBE CONNECTING 12X1/4 (SUCTIONS) ×1 IMPLANT
WATER STERILE IRR 1000ML POUR (IV SOLUTION) ×1 IMPLANT
YANKAUER SUCT BULB TIP NO VENT (SUCTIONS) IMPLANT

## 2022-09-20 NOTE — H&P (Signed)
Dawn Vaughn is an 41 y.o. female.   Chief Complaint: Right elbow pain HPI:  Dawn Vaughn is a 41 y.o. female who presents to the office reporting right elbow pain.  She has a known stress fracture of the proximal ulna.  Overall she has marginal improvement but still has pain with activities of daily living.  She has been having pain for about 3 months prior to her last office visit 07/13/2022.  Currently is breast-feeding.  Is taking over-the-counter vitamin D orally.  She wants to get to the gym and be able to exercise without concern.  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  Denies much in the way of mechanical symptoms  Past Medical History:  Diagnosis Date   Anemia    Diabetes mellitus without complication (HCC)    Gestational diabetes    Hypertension during pregnancy    Sickle cell trait [facility])     Past Surgical History:  Procedure Laterality Date   CERVICAL CERCLAGE Bilateral 11/19/2021   Procedure: CERCLAGE CERVICAL;  Surgeon: Osborn Coho, MD;  Location: [facility] LD [facility]S;  Service: Gynecology;  Laterality: Bilateral;   DILATION AND CURETTAGE OF UTERUS      Family History  Problem Relation Age of Onset   Obesity Mother    Kidney disease Mother    Hypertension Mother    Diabetes Mother    Heart disease Mother    Healthy Father    Early death Son    Cancer Maternal Grandfather    Social History:  reports that she has never smoked. She has never used smokeless tobacco. She reports that she does not currently use alcohol. She reports that she does not use drugs.  Allergies: No Known Allergies  Medications Prior to Admission  Medication Sig Dispense Refill   acetaminophen (TYLENOL) 325 MG tablet Take 2 tablets (650 mg total) by mouth every 4 (four) hours as needed (for pain scale < 4). 60 tablet 3   Cholecalciferol (VITAMIN D) 125 MCG (5000 UT) CAPS Take 1 capsule by mouth daily. 30 capsule 0   benzocaine-Menthol (DERMOPLAST)  20-0.5 % AERO Apply 1 application. topically as needed for irritation (perineal discomfort). (Patient not taking: Reported on 09/14/2022) 56 g 3   etonogestrel (NEXPLANON) 68 MG IMPL implant 1 each by Subdermal route once.     ibuprofen (ADVIL) 600 MG tablet Take 1 tablet (600 mg total) by mouth every 6 (six) hours as needed for cramping. 90 tablet 3   naproxen (NAPROSYN) 500 MG tablet Take 1 tablet (500 mg total) by mouth 2 (two) times daily. (Patient not taking: Reported on 09/14/2022) 30 tablet 0   NIFEdipine (ADALAT CC) 60 MG 24 hr tablet Take 1 tablet (60 mg total) by mouth daily. (Patient not taking: Reported on 09/14/2022) 90 tablet 3    Results for orders placed or performed during the hospital encounter of 09/20/22 (from the past 48 hour(s))  CBC     Status: Abnormal   Collection Time: 09/20/22  2:17 PM  Result Value Ref Range   WBC 6.7 4.0 - 10.5 K/uL   RBC 5.52 (H) 3.87 - 5.11 MIL/uL   Hemoglobin 13.9 12.0 - 15.0 g/dL   HCT 45.8 09.9 - 83.3 %   MCV 76.1 (L) 80.0 - 100.0 fL   MCH 25.2 (L) 26.0 - 34.0 pg   MCHC 33.1 30.0 - 36.0 g/dL   RDW 82.5 05.3 - 97.6 %  Platelets 287 150 - 400 K/uL   nRBC 0.0 0.0 - 0.2 %    Comment: Performed at [facility], [place] [address], [place], [place] [address]  Pregnancy, urine POC     Status: None   Collection Time: 09/20/22  3:15 PM  Result Value Ref Range   Preg Test, Ur NEGATIVE NEGATIVE    Comment:        THE SENSITIVITY OF THIS METHODOLOGY IS >24 mIU/mL    No results found.  Review of Systems  Musculoskeletal:  Positive for arthralgias.  All other systems reviewed and are negative.   Blood pressure (!) 152/94, pulse 99, temperature 98.5 F (36.9 C), temperature source Oral, resp. rate 17, height 5\' 3"  (1.6 m), weight 104.3 kg, last menstrual period 09/11/2022, SpO2 97 %, not currently breastfeeding. Physical Exam Vitals reviewed.  HENT:     Head: Normocephalic.     Nose: Nose normal.     Mouth/Throat:     Mouth: Mucous  membranes are moist.  Eyes:     Pupils: Pupils are equal, round, and reactive to light.  Cardiovascular:     Rate and Rhythm: Normal rate.     Pulses: Normal pulses.  Pulmonary:     Effort: Pulmonary effort is normal.  Abdominal:     General: Abdomen is flat.  Musculoskeletal:     Cervical back: Normal range of motion.  Skin:    General: Skin is warm.     Capillary Refill: Capillary refill takes less than 2 seconds.  Neurological:     General: No focal deficit present.     Mental Status: She is alert.  Psychiatric:        Mood and Affect: Mood normal.     Ortho exam demonstrates some tenderness to palpation around the proximal ulna region.  EPL FPL muscle strength intact.  No masses lymphadenopathy or skin changes noted in that elbow region.  Range of motion is full including flexion extension pronation supination Assessment/Plan Impression is stress fracture right elbow which is now about 5 months old with continued symptoms.  Discussed continued operative and nonoperative treatment options.  In general patient would like to be active in terms of getting back to the gym.  I think her best bet for that would be compression plate fixation of that proximal ulnar region.  That should stabilize the fracture enough to allow for unrestricted activity.  The risk and benefits of that are discussed with the patient include not limited to infection or vessel damage incomplete restoration of pain.  Patient understands risk benefits and wishes to proceed.  All questions answered.  I do think plate fixation would be a better option than intramedullary screw fixation.    Anderson Malta, MD 09/20/2022, 4:14 PM

## 2022-09-20 NOTE — Anesthesia Postprocedure Evaluation (Signed)
Anesthesia Post Note  Patient: Dawn Vaughn  Procedure(s) Performed: RIGHT ELBOW PROXIMAL ULNA STRESS FRACTURE FIXATION (Right: Elbow)     Patient location during evaluation: PACU Anesthesia Type: General Level of consciousness: awake Pain management: pain level controlled Vital Signs Assessment: post-procedure vital signs reviewed and stable Respiratory status: spontaneous breathing, nonlabored ventilation, respiratory function stable and patient connected to nasal cannula oxygen Cardiovascular status: blood pressure returned to baseline and stable Postop Assessment: no apparent nausea or vomiting Anesthetic complications: no   No notable events documented.  Last Vitals:  Vitals:   09/20/22 1830 09/20/22 1845  BP: (!) 156/94 (!) 137/99  Pulse: (!) 101 95  Resp: 13 16  Temp:  (!) 36.1 C  SpO2: 98% 96%    Last Pain:  Vitals:   09/20/22 1845  TempSrc:   PainSc: 0-No pain                 Jasir Rother P Algis Lehenbauer

## 2022-09-20 NOTE — Anesthesia Procedure Notes (Addendum)
Procedure Name: LMA Insertion Date/Time: 09/20/2022 4:55 PM  Performed by: Minerva Ends, CRNAPre-anesthesia Checklist: Patient identified, Emergency Drugs available, Suction available and Patient being monitored Patient Re-evaluated:Patient Re-evaluated prior to induction Oxygen Delivery Method: Circle system utilized Preoxygenation: Pre-oxygenation with 100% oxygen Induction Type: IV induction Ventilation: Mask ventilation without difficulty LMA: LMA inserted LMA Size: 4.0 Tube type: Oral Number of attempts: 1 Placement Confirmation: positive ETCO2 and breath sounds checked- equal and bilateral Tube secured with: Tape Dental Injury: Teeth and Oropharynx as per pre-operative assessment

## 2022-09-20 NOTE — Anesthesia Preprocedure Evaluation (Signed)
Anesthesia Evaluation  Patient identified by MRN, date of birth, ID band Patient awake    Reviewed: Allergy & Precautions, NPO status , Patient's Chart, lab work & pertinent test results  Airway Mallampati: II  TM Distance: >3 FB Neck ROM: full    Dental no notable dental hx.    Pulmonary neg pulmonary ROS,    Pulmonary exam normal breath sounds clear to auscultation       Cardiovascular hypertension, Pt. on medications negative cardio ROS Normal cardiovascular exam Rhythm:regular Rate:Normal     Neuro/Psych negative neurological ROS  negative psych ROS   GI/Hepatic negative GI ROS, Neg liver ROS, GERD  ,  Endo/Other  negative endocrine ROSMorbid obesity  Renal/GU negative Renal ROS  negative genitourinary   Musculoskeletal negative musculoskeletal ROS (+)   Abdominal (+) + obese,   Peds negative pediatric ROS (+)  Hematology negative hematology ROS (+) Blood dyscrasia, Sickle cell trait and anemia ,   Anesthesia Other Findings   Reproductive/Obstetrics negative OB ROS AMA  GDM HSV Gestational HTN Hx/o incompetent cervix S/P Cervical cerclage removed in office yesterday                             Anesthesia Physical  Anesthesia Plan  ASA: 3  Anesthesia Plan: General   Post-op Pain Management:    Induction: Intravenous  PONV Risk Score and Plan: 3 and Ondansetron, Dexamethasone, Midazolam and Treatment may vary due to age or medical condition  Airway Management Planned: LMA  Additional Equipment:   Intra-op Plan:   Post-operative Plan: Extubation in OR  Informed Consent: I have reviewed the patients History and Physical, chart, labs and discussed the procedure including the risks, benefits and alternatives for the proposed anesthesia with the patient or authorized representative who has indicated his/her understanding and acceptance.       Plan Discussed with:  Anesthesiologist  Anesthesia Plan Comments:         Anesthesia Quick Evaluation

## 2022-09-20 NOTE — Telephone Encounter (Signed)
IC patient, no answer.  LMVM advised sent her mychart message forms had been completed. Also mentioned again to her of our conversation that she would need to pick up forms to complete her portion.

## 2022-09-20 NOTE — Transfer of Care (Signed)
Immediate Anesthesia Transfer of Care Note  Patient: MALESHA SULIMAN  Procedure(s) Performed: RIGHT ELBOW PROXIMAL ULNA STRESS FRACTURE FIXATION (Right: Elbow)  Patient Location: PACU  Anesthesia Type:General  Level of Consciousness: awake, alert  and oriented  Airway & Oxygen Therapy: Patient Spontanous Breathing  Post-op Assessment: Report given to RN and Post -op Vital signs reviewed and stable  Post vital signs: Reviewed and stable  Last Vitals:  Vitals Value Taken Time  BP 161/92 09/20/22 1815  Temp    Pulse 107 09/20/22 1818  Resp 12 09/20/22 1818  SpO2 97 % 09/20/22 1818  Vitals shown include unvalidated device data.  Last Pain:  Vitals:   09/20/22 1439  TempSrc: Oral  PainSc: 0-No pain      Patients Stated Pain Goal: 3 (67/34/19 3790)  Complications: No notable events documented.

## 2022-09-22 NOTE — Op Note (Signed)
[name], STAUBS MEDICAL RECORD NO: [id number] ACCOUNT NO: [id number] DATE OF BIRTH: 01-30-1981 FACILITY: [facility] LOCATION: [facility] PH[facility]ICIAN: Yetta Barre. Marlou Sa, MD  Operative Report   DATE OF PROCEDURE: 09/20/2022  PREOPERATIVE DIAGNOSIS:  Right arm proximal ulnar stress fracture.  POSTOPERATIVE DIAGNOSIS:  Right arm proximal ulnar stress fracture.  PROCEDURE:  Right arm stress fracture plating.  SURGEON:  Yetta Barre. Marlou Sa, MD  ASSISTANT:  April Green, RNFA.  INDICATIONS:  This is a 41 year old patient with right proximal ulna fracture who presents for operative management after explanation of risks and benefits.  DESCRIPTION OF PROCEDURE:  The patient was brought to the operating room where general anesthetic was induced.  Preoperative antibiotics administered.  Timeout was called.  Right arm was prescrubbed with alcohol and Betadine, allowed to air dry, prepped  with DuraPrep solution and draped in sterile manner.  Ioban used to cover the operative field.  Arm was elevated and exsanguinated using Esmarch wrap.  Timeout was called.  Incision made about 1 cm distal to the olecranon tip along the crest of the ulna  for a total length of about 7 cm. Skin and subcutaneous tissue were sharply divided.  Periosteum over the ulna was incised and careful subperiosteal dissection was performed.  With the ulna exposed a Biomet 6 hole 1/3rd tubular plate was applied.   Combination of locking and nonlocking screws were placed to maximize compression.  Fluoroscopy was utilized to make sure that joint penetration did not occur.  Under fluoroscopic evaluation, the plate placement demonstrated 3 screws proximal and 3 screws  distal to the fracture.  Good fixation was achieved.  A thorough irrigation was performed.  Tourniquet was released.  Bleeding points encountered controlled using electrocautery.  Vancomycin powder placed on the plate.  The fascia was then closed over the plate using 0 Vicryl  suture followed by interrupted inverted 2-0 Vicryl suture and 3-0  Monocryl with Steri-Strips and impervious dressing applied along with Ace wrap and sling.  The patient tolerated the procedure well without immediate complications, transferred to recovery room in stable condition.   PUS D: 09/22/2022 1:06:03 pm T: 09/22/2022 2:48:00 pm  JOB: [id number]/ [id number]

## 2022-09-22 NOTE — Brief Op Note (Signed)
   09/20/2022  1:03 PM  PATIENT:  Dawn Vaughn  41 y.o. female  PRE-OPERATIVE DIAGNOSIS:  right elbow proximal ulna stress fracture  POST-OPERATIVE DIAGNOSIS:  right elbow proximal ulna stress fracture  PROCEDURE:  Procedure(s): RIGHT ELBOW PROXIMAL ULNA STRESS FRACTURE FIXATION  SURGEON:  Surgeon(s): Meredith Pel, MD  ASSISTANT: green rnfa  ANESTHESIA:   general  EBL: 10 ml    Total I/O In: 700 [I.V.:700] Out: -   BLOOD ADMINISTERED: none  DRAINS: none   LOCAL MEDICATIONS USED:  vanco marcaine mso4 clonidine  SPECIMEN:  No Specimen  COUNTS:  YES  TOURNIQUET:  * Missing tourniquet times found for documented tourniquets in log: [id number] *  DICTATION: .Other Dictation: Dictation Number [id number]  PLAN OF CARE: Discharge to home after PACU  PATIENT DISPOSITION:  PACU - hemodynamically stable

## 2022-09-23 ENCOUNTER — Encounter (HOSPITAL_COMMUNITY): Payer: Self-pay | Admitting: Orthopedic Surgery

## 2022-09-25 DIAGNOSIS — S52009A Unspecified fracture of upper end of unspecified ulna, initial encounter for closed fracture: Secondary | ICD-10-CM

## 2022-09-30 ENCOUNTER — Ambulatory Visit (INDEPENDENT_AMBULATORY_CARE_PROVIDER_SITE_OTHER): Payer: BC Managed Care – PPO | Admitting: Surgical

## 2022-09-30 ENCOUNTER — Ambulatory Visit (INDEPENDENT_AMBULATORY_CARE_PROVIDER_SITE_OTHER): Payer: BC Managed Care – PPO

## 2022-09-30 ENCOUNTER — Encounter: Payer: Self-pay | Admitting: Orthopedic Surgery

## 2022-09-30 DIAGNOSIS — S52091G Other fracture of upper end of right ulna, subsequent encounter for closed fracture with delayed healing: Secondary | ICD-10-CM

## 2022-09-30 MED ORDER — IBUPROFEN 800 MG PO TABS: 800.0000 mg | ORAL_TABLET | Freq: Three times a day (TID) | ORAL | 0 refills | Status: AC | PRN

## 2022-10-02 ENCOUNTER — Encounter: Payer: Self-pay | Admitting: Orthopedic Surgery

## 2022-10-02 NOTE — Progress Notes (Signed)
Post-Op Visit Note   Patient: Dawn Vaughn           Date of Birth: 19-Jan-1981           MRN: [id number] Visit Date: 09/30/2022 PCP: Patient, No Pcp Per   Assessment & Plan:  Chief Complaint:  Chief Complaint  Patient presents with   Right Elbow - Follow-up   Visit Diagnoses:  1. Other closed fracture of proximal end of right ulna with delayed healing, subsequent encounter     Plan: Patient is a 41 year old female who presents for evaluation following right elbow proximal ulnar stress fracture fixation on 09/20/2022.  She notes that she is having some continued pain but overall pain is significantly improved compared with immediately after surgery.  She was never able to obtain pain medicine prescription from her pharmacy so she is just been taking ibuprofen and Tylenol with decent relief of her symptoms.  She denies any fevers or chills.  No mechanical symptoms.  She has been using the postop sling on and off.  Radiographs of the right elbow taken today demonstrate hardware intact in the proximal ulna with no evidence of hardware failure.  Patient has excellent range of motion with 0 degrees of elbow extension and about 120 degrees of elbow flexion.  She has equivalent pronation and supination actively and passively compared to contralateral extremity.  Incisions healing well without evidence of infection or dehiscence.  She has intact EPL, FPL, finger abduction, finger abduction, wrist extension.  Plan for ADLs as tolerated.  May completely discontinue the sling at this point.  No heavy lifting.  Follow-up in 4 weeks for clinical recheck with Dr. Marlou Sa  Follow-Up Instructions: No follow-ups on file.   Orders:  Orders Placed This Encounter  Procedures   XR Elbow Complete Right (3+View)   Meds ordered this encounter  Medications   ibuprofen (ADVIL) 800 MG tablet    Sig: Take 1 tablet (800 mg total) by mouth every 8 (eight) hours as needed.    Dispense:  30 tablet    Refill:   0    Imaging: No results found.  PMFS History: Patient Active Problem List   Diagnosis Date Noted   Closed fracture of proximal end of ulna without additional fracture    Hx of trichomoniasis 04/27/2022   HSV-2 infection 04/27/2022   Chronic hypertension with superimposed pre-eclampsia 17-Apr-2022   Gestational diabetes mellitus, class A2 04/17/22   Maternal obesity syndrome, antepartum, third trimester 04/17/22   Antiphospholipid syndrome complicating pregnancy, antepartum ([place]) 2022-04-17   AMA (advanced maternal age) multigravida 35+ 04-17-2022   H/O intrauterine fetal death, currently pregnant 04/17/2022   Sickle cell trait ([place]) 12/15/2021   Past Medical History:  Diagnosis Date   Anemia    Diabetes mellitus without complication ([place])    Gestational diabetes    Hypertension during pregnancy    Sickle cell trait ([place])     Family History  Problem Relation Age of Onset   Obesity Mother    Kidney disease Mother    Hypertension Mother    Diabetes Mother    Heart disease Mother    Healthy Father    Early death Son    Cancer Maternal Grandfather     Past Surgical History:  Procedure Laterality Date   CERVICAL CERCLAGE Bilateral 11/19/2021   Procedure: CERCLAGE CERVICAL;  Surgeon: Everett Graff, MD;  Location: [facility];  Service: Gynecology;  Laterality: Bilateral;   DILATION AND CURETTAGE OF UTERUS  ORIF ULNAR FRACTURE Right 09/20/2022   Procedure: RIGHT ELBOW PROXIMAL ULNA STRESS FRACTURE FIXATION;  Surgeon: Cammy Copa, MD;  Location: [facility] OR;  Service: Orthopedics;  Laterality: Right;   Social History   Occupational History   Not on file  Tobacco Use   Smoking status: Never   Smokeless tobacco: Never  Vaping Use   Vaping Use: Never used  Substance and Sexual Activity   Alcohol use: Not Currently   Drug use: Never   Sexual activity: Not Currently

## 2022-10-31 ENCOUNTER — Ambulatory Visit (INDEPENDENT_AMBULATORY_CARE_PROVIDER_SITE_OTHER): Payer: BC Managed Care – PPO | Admitting: Orthopedic Surgery

## 2022-10-31 ENCOUNTER — Ambulatory Visit (INDEPENDENT_AMBULATORY_CARE_PROVIDER_SITE_OTHER): Payer: BC Managed Care – PPO

## 2022-10-31 DIAGNOSIS — S52091G Other fracture of upper end of right ulna, subsequent encounter for closed fracture with delayed healing: Secondary | ICD-10-CM

## 2022-11-01 ENCOUNTER — Encounter: Payer: Self-pay | Admitting: Orthopedic Surgery

## 2022-11-01 NOTE — Progress Notes (Signed)
   Post-Op Visit Note   Patient: Dawn Vaughn           Date of Birth: 08-25-1981           MRN: [id number] Visit Date: 10/31/2022 PCP: Patient, No Pcp Per   Assessment & Plan:  Chief Complaint:  Chief Complaint  Patient presents with   Right Elbow - Routine Post Op    09/20/22 right elbow proximal ulna stress fracture fixaton   Visit Diagnoses:  1. Other closed fracture of proximal end of right ulna with delayed healing, subsequent encounter     Plan: Patient presents now about 5 weeks out right elbow stress fracture fixation with proximal ulna plating.  Patient is here with her child and nephew.  Overall she has been doing well.  Reports some occasional pain in the forearm region proximally which is most consistent with healing soft tissue around the elbow.  She has full range of motion pronation supination flexion extension of the elbow with excellent triceps strength.  No fluctuance redness or erythema around the incision.  Radiographs also look very good.  Plan at this time is activity as tolerated.  Follow-up as needed.  Patient is pleased with her result and notices significant reduction to almost none of her preop pain.  Follow-Up Instructions: No follow-ups on file.   Orders:  Orders Placed This Encounter  Procedures   XR Elbow Complete Right (3+View)   No orders of the defined types were placed in this encounter.   Imaging: No results found.  PMFS History: Patient Active Problem List   Diagnosis Date Noted   Closed fracture of proximal end of ulna without additional fracture    Hx of trichomoniasis 04/27/2022   HSV-2 infection 04/27/2022   Chronic hypertension with superimposed pre-eclampsia 2022/03/24   Gestational diabetes mellitus, class A2 2022/03/24   Maternal obesity syndrome, antepartum, third trimester Mar 24, 2022   Antiphospholipid syndrome complicating pregnancy, antepartum ([facility]) Mar 24, 2022   AMA (advanced maternal age) multigravida 35+ 2022/03/24    H/O intrauterine fetal death, currently pregnant 03/24/2022   Sickle cell trait ([facility]) 12/15/2021   Past Medical History:  Diagnosis Date   Anemia    Diabetes mellitus without complication ([facility])    Gestational diabetes    Hypertension during pregnancy    Sickle cell trait ([facility])     Family History  Problem Relation Age of Onset   Obesity Mother    Kidney disease Mother    Hypertension Mother    Diabetes Mother    Heart disease Mother    Healthy Father    Early death Son    Cancer Maternal Grandfather     Past Surgical History:  Procedure Laterality Date   CERVICAL CERCLAGE Bilateral 11/19/2021   Procedure: CERCLAGE CERVICAL;  Surgeon: Osborn Coho, MD;  [facility]: [facility];  Service: Gynecology;  Laterality: Bilateral;   DILATION AND CURETTAGE OF UTERUS     ORIF ULNAR FRACTURE Right 09/20/2022   Procedure: RIGHT ELBOW PROXIMAL ULNA STRESS FRACTURE FIXATION;  Surgeon: Cammy Copa, MD;  Location: [facility] OR;  Service: Orthopedics;  Laterality: Right;   Social History   Occupational History   Not on file  Tobacco Use   Smoking status: Never   Smokeless tobacco: Never  Vaping Use   Vaping Use: Never used  Substance and Sexual Activity   Alcohol use: Not Currently   Drug use: Never   Sexual activity: Not Currently

## 2023-01-26 ENCOUNTER — Ambulatory Visit
Admission: EM | Admit: 2023-01-26 | Discharge: 2023-01-26 | Disposition: A | Discharge status: Home / Self Care | Payer: BC Managed Care – PPO | Attending: Nurse Practitioner | Admitting: Nurse Practitioner

## 2023-01-26 DIAGNOSIS — H6691 Otitis media, unspecified, right ear: Secondary | ICD-10-CM | POA: Diagnosis not present

## 2023-01-26 MED ORDER — AMOXICILLIN 875 MG PO TABS: 875.0000 mg | ORAL_TABLET | Freq: Two times a day (BID) | ORAL | 0 refills | Status: AC

## 2023-01-26 MED ORDER — FLUTICASONE PROPIONATE 50 MCG/ACT NA SUSP: 1.0000 | Freq: Every day | NASAL | 0 refills | Status: AC

## 2023-01-26 NOTE — Discharge Instructions (Signed)
Flonase daily Amoxicillin twice daily for 10 days Continue over-the-counter Tylenol or ibuprofen as needed Follow-up with PCP if symptoms do not improve Please go to the ER for any worsening symptoms

## 2023-01-26 NOTE — ED Triage Notes (Signed)
Pt c/o right ear pain starting yesterday. Pt felt fullness in ear yesterday after washing her hair and worsening pain and fullness this am when waking up.

## 2023-01-26 NOTE — ED Provider Notes (Signed)
[facility]    CSN: [id number] Arrival date & time: 01/26/23  1344      History   Chief Complaint Chief Complaint  Patient presents with   Otalgia    HPI Dawn Vaughn is a 42 y.o. female presents for right ear pain.  Patient reports yesterday she developed right ear pain that is worsened.  She denies any fevers, cough, congestion, allergy symptoms.  States she did have a cold a few weeks ago.  No recent swimming.  She has tried Tylenol with some improvement.  No other concerns at this time.   Otalgia   Past Medical History:  Diagnosis Date   Anemia    Sickle cell trait [facility])     Patient Active Problem List   Diagnosis Date Noted   Closed fracture of proximal end of ulna without additional fracture    Hx of trichomoniasis 04/27/2022   HSV-2 infection 04/27/2022   Chronic hypertension with superimposed pre-eclampsia Mar 24, 2022   Gestational diabetes mellitus, class A2 March 24, 2022   Maternal obesity syndrome, antepartum, third trimester 2022-03-24   Antiphospholipid syndrome complicating pregnancy, antepartum ([place]) 2022/03/24   AMA (advanced maternal age) multigravida 35+ 2022-03-24   H/O intrauterine fetal death, currently pregnant 03-24-2022   Sickle cell trait ([place]) 12/15/2021    Past Surgical History:  Procedure Laterality Date   CERVICAL CERCLAGE Bilateral 11/19/2021   Procedure: CERCLAGE CERVICAL;  Surgeon: Everett Graff, MD;  [facility]: [facility];  Service: Gynecology;  Laterality: Bilateral;   DILATION AND CURETTAGE OF UTERUS     ORIF ULNAR FRACTURE Right 09/20/2022   Procedure: RIGHT ELBOW PROXIMAL ULNA STRESS FRACTURE FIXATION;  Surgeon: Meredith Pel, MD;  Location: [place];  Service: Orthopedics;  Laterality: Right;    OB History     Gravida  2   Para  1   Term      Preterm  1   AB      Living  0      SAB      IAB      Ectopic      Multiple      Live Births  0        Obstetric Comments  With first did not  know she was preg, was still having "periods" every month; D&C for retained placenta          Home Medications    Prior to Admission medications   Medication Sig Start Date End Date Taking? Authorizing Provider  amoxicillin (AMOXIL) 875 MG tablet Take 1 tablet (875 mg total) by mouth 2 (two) times daily for 10 days. 01/26/23 02/05/23 Yes Melynda Ripple, NP  etonogestrel (NEXPLANON) 68 MG IMPL implant 1 each by Subdermal route once.   Yes [provider]  fluticasone (FLONASE) 50 MCG/ACT nasal spray Place 1 spray into both nostrils daily. 01/26/23  Yes Melynda Ripple, NP  ibuprofen (ADVIL) 800 MG tablet Take 1 tablet (800 mg total) by mouth every 8 (eight) hours as needed. 09/30/22  Yes Magnant, Gerrianne Scale, PA-C  acetaminophen (TYLENOL) 325 MG tablet Take 2 tablets (650 mg total) by mouth every 4 (four) hours as needed (for pain scale < 4). 04/30/22   Sanjuana Kava, MD  Cholecalciferol (VITAMIN D) 125 MCG (5000 UT) CAPS Take 1 capsule by mouth daily. 08/23/22   Magnant, Charles L, PA-C  methocarbamol (ROBAXIN) 500 MG tablet Take 1 tablet (500 mg total) by mouth every 8 (eight) hours as needed for muscle spasms. 09/20/22  Meredith Pel, MD  oxyCODONE (OXY IR/ROXICODONE) 5 MG immediate release tablet Take 1 tablet (5 mg total) by mouth every 4 (four) hours as needed for severe pain. 09/20/22   Meredith Pel, MD    Family History Family History  Problem Relation Age of Onset   Obesity Mother    Kidney disease Mother    Hypertension Mother    Diabetes Mother    Heart disease Mother    Healthy Father    Early death Son    Cancer Maternal Grandfather     Social History Social History   Tobacco Use   Smoking status: Never   Smokeless tobacco: Never  Vaping Use   Vaping Use: Never used  Substance Use Topics   Alcohol use: Not Currently   Drug use: Never     Allergies   Patient has no known allergies.   Review of Systems Review of Systems  HENT:  Positive for ear  pain.      Physical Exam Triage Vital Signs ED Triage Vitals  Enc Vitals Group     BP 01/26/23 1355 (!) 135/91     Pulse Rate 01/26/23 1355 90     Resp 01/26/23 1355 18     Temp 01/26/23 1355 97.9 F (36.6 C)     Temp Source 01/26/23 1355 Oral     SpO2 01/26/23 1355 97 %     Weight --      Height --      Head Circumference --      Peak Flow --      Pain Score 01/26/23 1352 4     Pain Loc --      Pain Edu? --      Excl. in [place]? --    No data found.  Updated Vital Signs BP (!) 135/91 (BP Location: Right Arm)   Pulse 90   Temp 97.9 F (36.6 C) (Oral)   Resp 18   LMP 10/26/2022 (Approximate)   SpO2 97%   Visual Acuity Right Eye Distance:   Left Eye Distance:   Bilateral Distance:    Right Eye Near:   Left Eye Near:    Bilateral Near:     Physical Exam Vitals and nursing note reviewed.  Constitutional:      General: She is not in acute distress.    Appearance: She is well-developed. She is not ill-appearing.  HENT:     Head: Normocephalic and atraumatic.     Right Ear: Ear canal normal. Tympanic membrane is erythematous.     Left Ear: Tympanic membrane and ear canal normal.     Nose: No congestion.     Mouth/Throat:     Mouth: Mucous membranes are moist.     Pharynx: Oropharynx is clear. Uvula midline. No posterior oropharyngeal erythema.     Tonsils: No tonsillar exudate or tonsillar abscesses.  Eyes:     Conjunctiva/sclera: Conjunctivae normal.     Pupils: Pupils are equal, round, and reactive to light.  Cardiovascular:     Rate and Rhythm: Normal rate and regular rhythm.     Heart sounds: Normal heart sounds.  Pulmonary:     Effort: Pulmonary effort is normal.     Breath sounds: Normal breath sounds.  Musculoskeletal:     Cervical back: Normal range of motion and neck supple.  Lymphadenopathy:     Cervical: No cervical adenopathy.  Skin:    General: Skin is warm and dry.  Neurological:  General: No focal deficit present.     Mental Status:  She is alert and oriented to person, place, and time.  Psychiatric:        Mood and Affect: Mood normal.        Behavior: Behavior normal.      UC Treatments / Results  Labs (all labs ordered are listed, but only abnormal results are displayed) Labs Reviewed - No data to display  EKG   Radiology No results found.  Procedures Procedures (including critical care time)  Medications Ordered in UC Medications - No data to display  Initial Impression / Assessment and Plan / UC Course  I have reviewed the triage vital signs and the nursing notes.  Pertinent labs & imaging results that were available during my care of the patient were reviewed by me and considered in my medical decision making (see chart for details).     Amoxicillin Flonase Continue OTC analgesics as needed Follow-up with PCP if symptoms do not improve ER precautions reviewed and patient verbalized understanding Final Clinical Impressions(s) / UC Diagnoses   Final diagnoses:  Right otitis media, unspecified otitis media type     Discharge Instructions      Flonase daily Amoxicillin twice daily for 10 days Continue over-the-counter Tylenol or ibuprofen as needed Follow-up with PCP if symptoms do not improve Please go to the ER for any worsening symptoms   ED Prescriptions     Medication Sig Dispense Auth. Provider   amoxicillin (AMOXIL) 875 MG tablet Take 1 tablet (875 mg total) by mouth 2 (two) times daily for 10 days. 20 tablet Melynda Ripple, NP   fluticasone (FLONASE) 50 MCG/ACT nasal spray Place 1 spray into both nostrils daily. 15.8 mL Melynda Ripple, NP      PDMP not reviewed this encounter.   Melynda Ripple, NP 01/26/23 1414

## 2024-01-28 IMAGING — US US MFM OB FOLLOW-UP
1 series · 13 of 28 positions shown · non-contrast
Comparison: none

[Series 1: us mfm ob follow-up · 31 acquisitions, 13 frames shown]
[im 2/31]
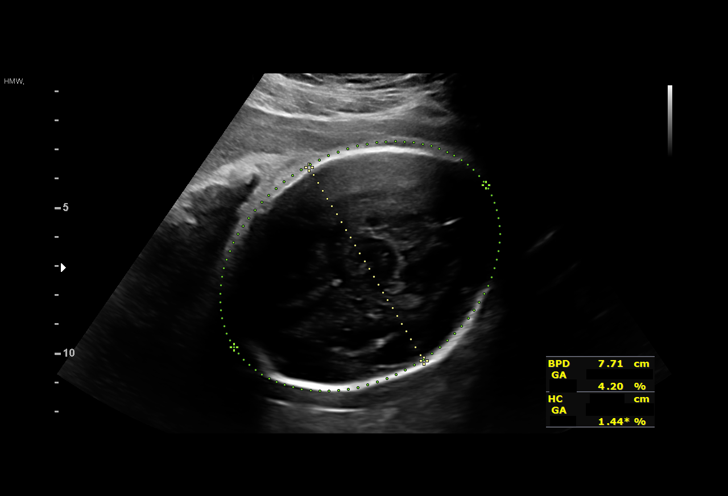
[im 4/31]
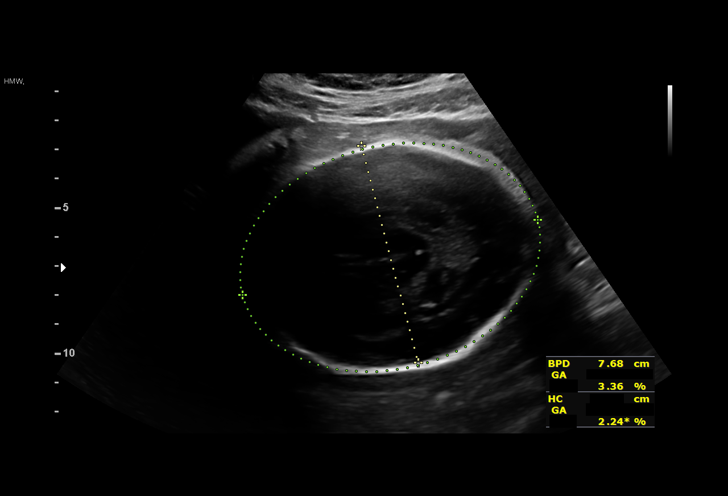
[im 6/31]
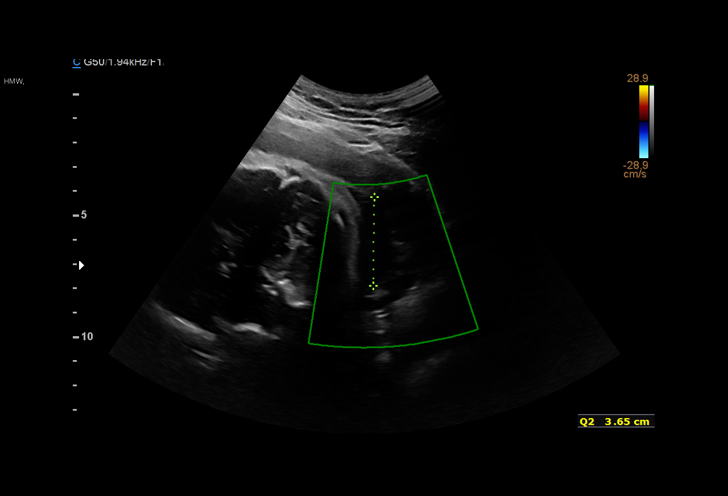
[im 8/31]
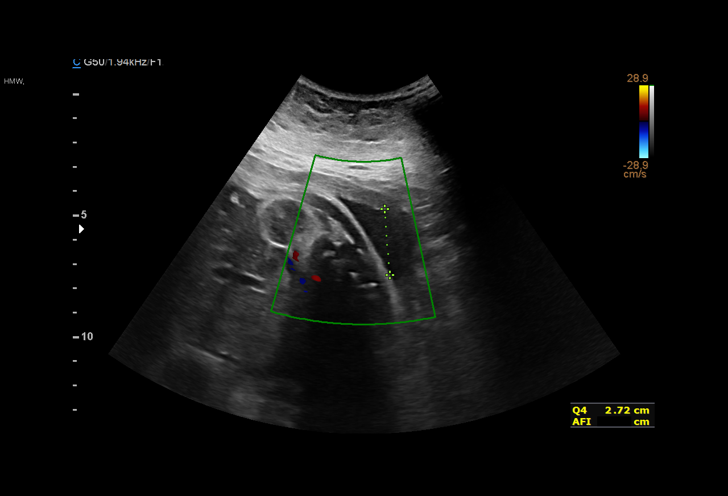
[im 11/31]
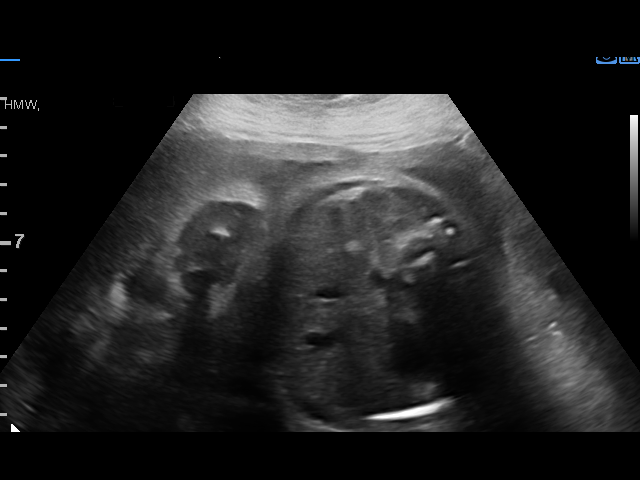
[im 13/31]
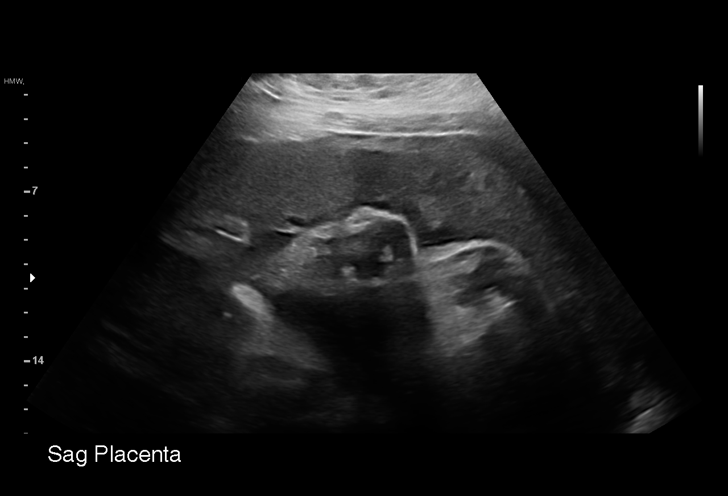
[im 16/31]
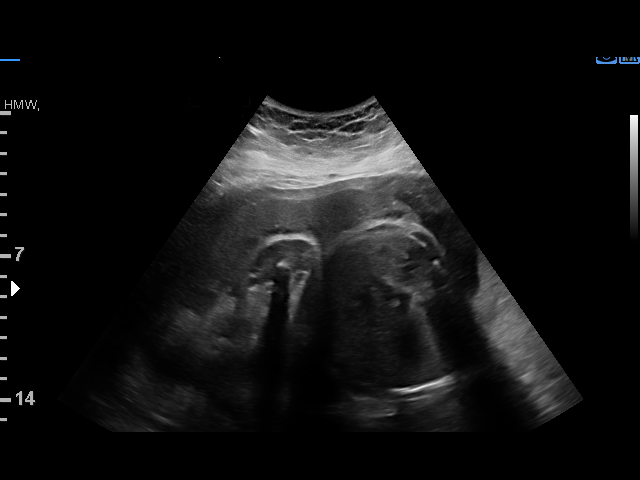
[im 18/31]
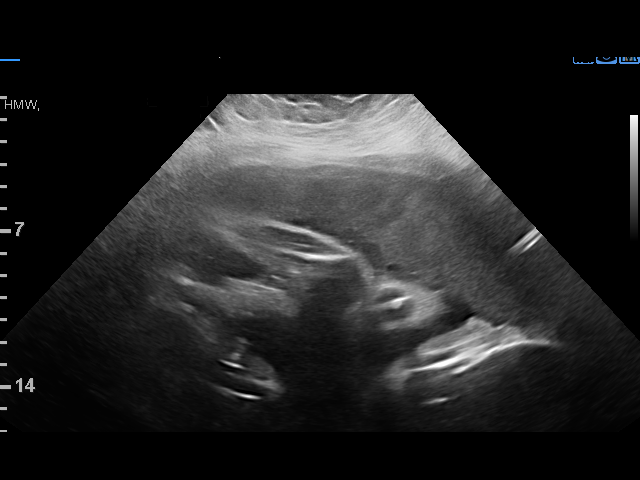
[im 21/31]
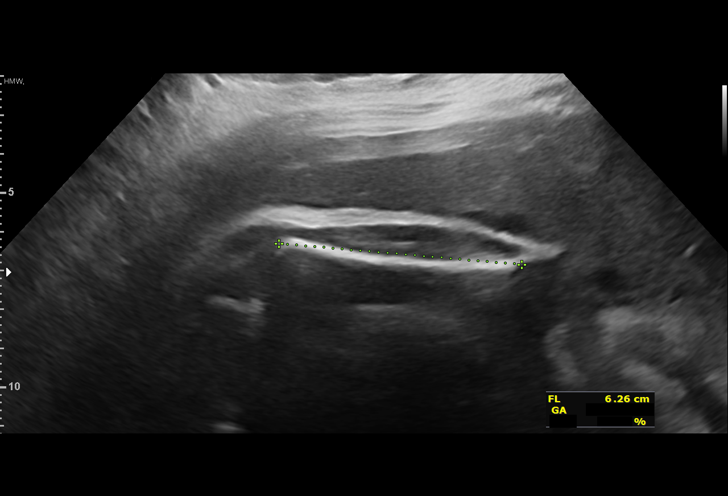
[im 23/31]
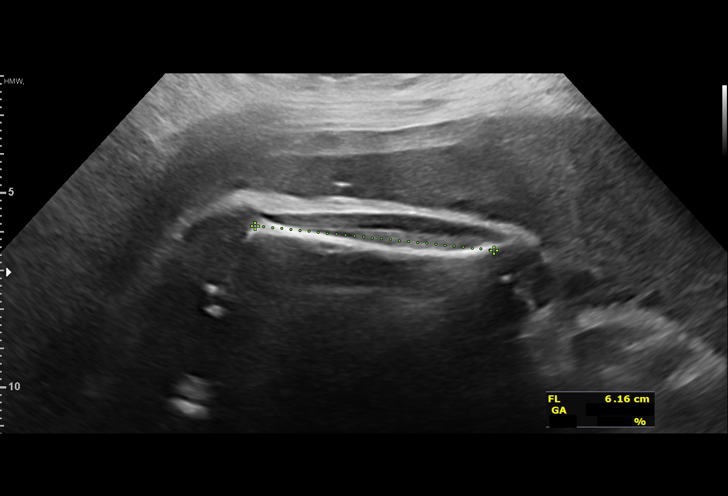
[im 25/31]
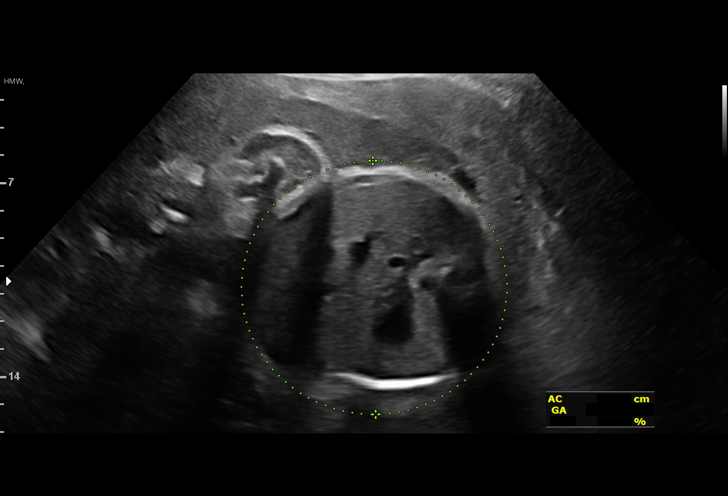
[im 27/31]
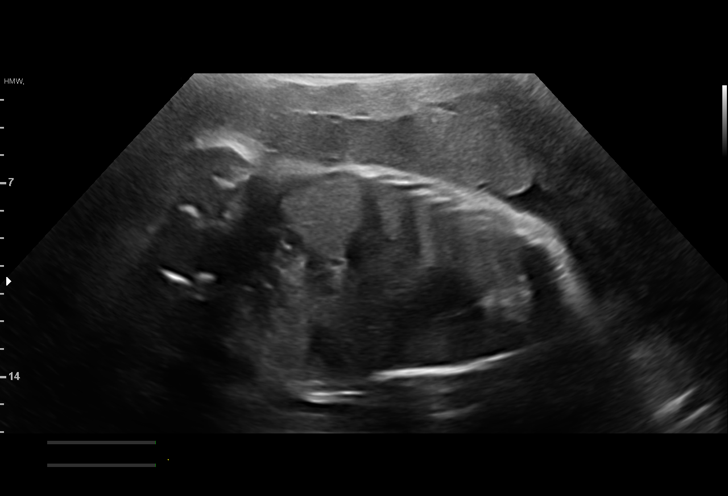
[im 29/31]
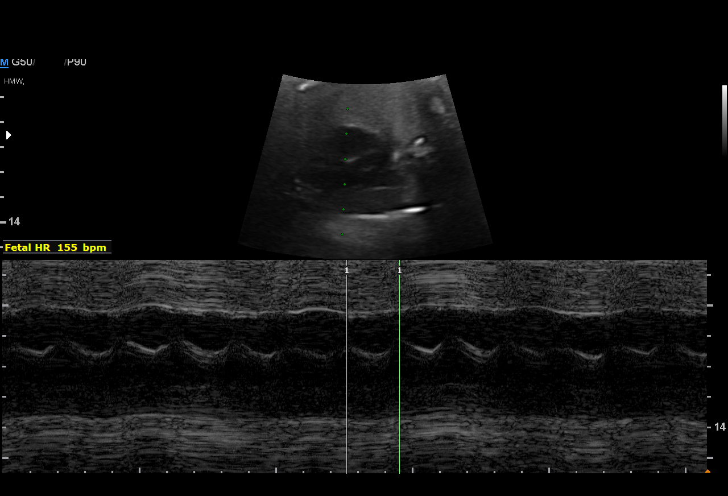

[13 of 28 positions shown; findings below may reference images not displayed]

Obstetrics &
                                                            Gynecology
                                                            [address]
                                                            [place].

                                                      JULIO JUAN

Indications

 Hypertension - Gestational
 Obesity complicating pregnancy, third
 trimester (BMI 40)
 Gestational diabetes in pregnancy,
 controlled by oral hypoglycemic drugs
 32 weeks gestation of pregnancy
 Encounter for other antenatal screening
 follow-up
 Cervical cerclage suture present, third
 trimester
 Advanced maternal age multigravida 35+,
 third trimester
 History of sickle cell trait
 Poor obstetric history: Previous midtrimester
 loss (25 week [place] at home)
Fetal Evaluation
 Num Of Fetuses:         1
 Fetal Heart Rate(bpm):  155
 Cardiac Activity:       Observed
 Presentation:           Cephalic
 Placenta:               Anterior
 P. Cord Insertion:      Previously Visualized

 Amniotic Fluid
 AFI FV:      Within normal limits

 AFI Sum(cm)     %Tile       Largest Pocket(cm)
 16.6            60

 RUQ(cm)       RLQ(cm)       LUQ(cm)        LLQ(cm)

Biophysical Evaluation

 Amniotic F.V:   Within normal limits       F. Tone:        Observed
 F. Movement:    Observed                   Score:          [DATE]
 F. Breathing:   Observed
Biometry

 BPD:      76.4  mm     G. Age:  30w 5d        3.1  %    CI:        72.06   %    70 - 86
                                                         FL/HC:      21.5   %    19.9 -
 HC:      286.4  mm     G. Age:  31w 3d        2.3  %    HC/AC:      0.97        0.96 -
 AC:      293.8  mm     G. Age:  33w 3d         70  %    FL/BPD:     80.6   %    71 - 87
 FL:       61.6  mm     G. Age:  32w 0d         19  %    FL/AC:      21.0   %    20 - 24

 Est. FW:    6887  gm      4 lb 6 oz     34  %
OB History

 Gravidity:    2         Term:   0        Prem:   1
 Living:       0
Gestational Age

 LMP:           34w 2d        Date:  08/01/21                 EDD:   05/08/22
 U/S Today:     31w 6d                                        EDD:   05/25/22
 Best:          32w 5d     Det. By:  U/S  (12/15/21)          EDD:   05/19/22
Anatomy

 Cranium:               Appears normal         Aortic Arch:            Previously seen
 Cavum:                 Previously seen        Ductal Arch:            Previously seen
 Ventricles:            Previously seen        Diaphragm:              Previously seen
 Choroid Plexus:        Previously seen        Stomach:                Appears normal, left
                                                                       sided
 Cerebellum:            Previously seen        Abdomen:                Previously seen
 Posterior Fossa:       Previously seen        Abdominal Wall:         Previously seen
 Nuchal Fold:           Not applicable (>20    Cord Vessels:           Previously seen
                        wks GA)
 Face:                  Orbits and profile     Kidneys:                Appear normal
                        previously seen
 Lips:                  Previously seen        Bladder:                Appears normal
 Thoracic:              Appears normal         Spine:                  Previously seen
 Heart:                 Previously seen        Upper Extremities:      Previously seen
 RVOT:                  Previously seen        Lower Extremities:      Previously seen
 LVOT:                  Previously seen

 Other:  VC, 3VV and 3VTV  previously visualized. Fetus previously seen to
         be male. Technically difficult due to maternal habitus and fetal
         position.
Cervix Uterus Adnexa

 Cervix
 Not visualized (advanced GA >47wks)

 Uterus
 No abnormality visualized.

 Right Ovary
 No adnexal mass visualized.

 Left Ovary
 No adnexal mass visualized.

 Cul De Sac
 No free fluid seen.

 Adnexa
 No abnormality visualized.
Impression

 Follow up growth due known GHTN, elevated BMI, new
 diagnosis of 79OIE and with a history of 25 week loss with
 cerclage.
 Normal interval growth with measurements consistent with
 dates
 Good fetal movement and amniotic fluid volume
 Biophysical profile [DATE]

 Ms.  Jhemboy was recently hospitalized for blood pressure
 exacerbation. She was discharged wit normal labs including
 UPC. She recieved BMZ and magnesium.

 She did not require initiation of hypertensive therapy.

 She was started on Lovenox in the hospital, I encouraged her
 to discontinue it given that she is mobile now.

 She had a inpatient consultaton and we recommended 2x
 weekly testing (split between our offices and her providers
 offices).

 Her blood pressure was normal today of 133/86 mmHg.

 I explained that likely we will consider delivery between 37-38
 weeks.
Recommendations

 Continue 2x weekly testing
 Repeat growth in 4 weeks.

## 2024-02-25 IMAGING — US US MFM FETAL BPP W/O NON-STRESS
1 series · 12 of 28 positions shown · non-contrast
Comparison: none

[Series 1: us mfm fetal bpp w/o non-stress · 51 acquisitions, 12 frames shown]
[im 2/51]
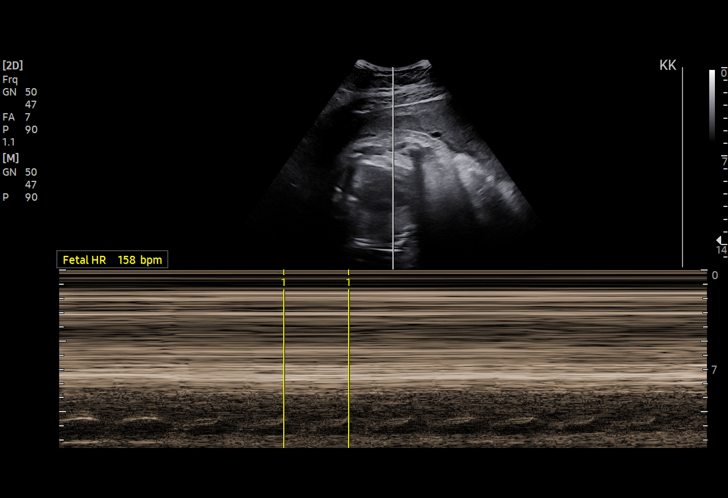
[im 6/51]
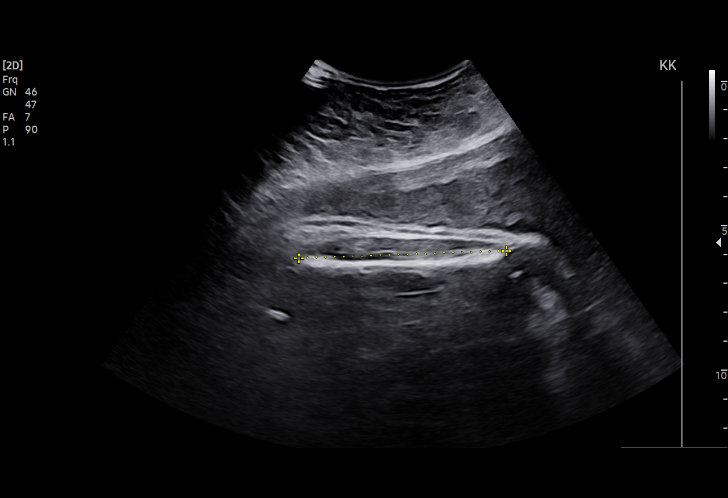
[im 10/51]
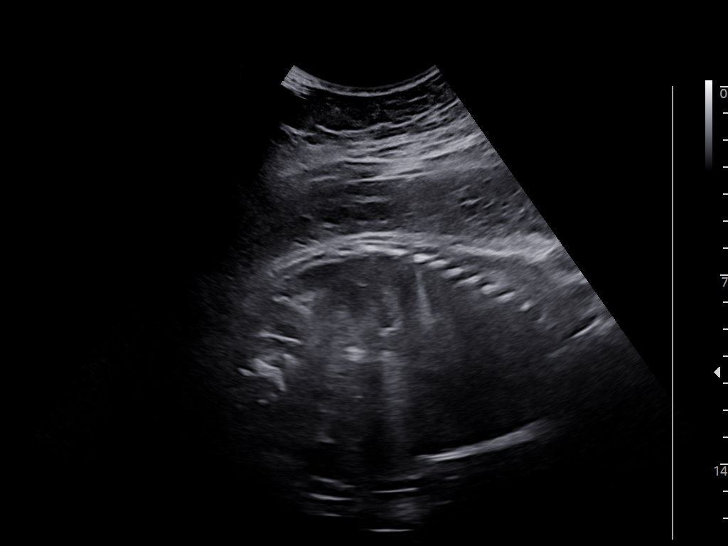
[im 15/51]
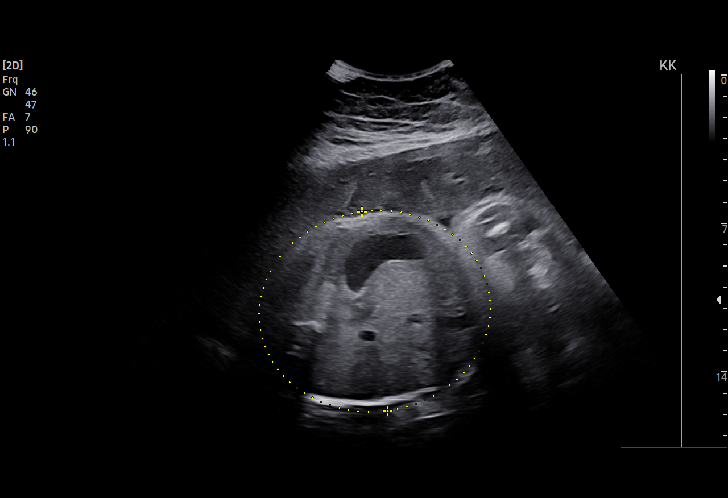
[im 19/51]
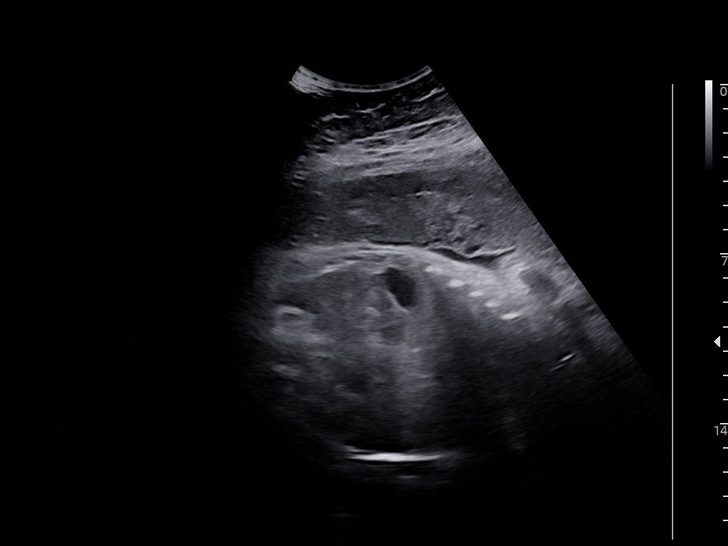
[im 23/51]
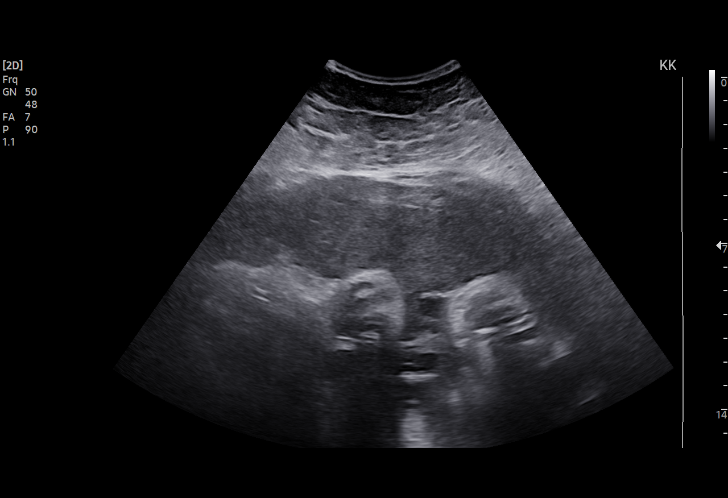
[im 28/51]
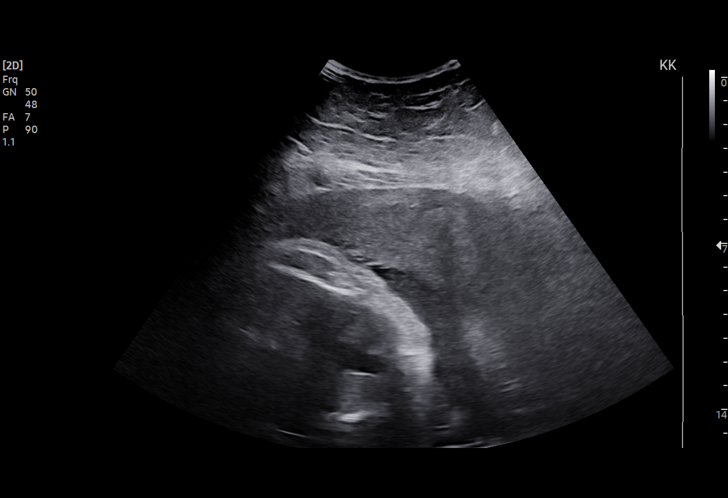
[im 32/51]
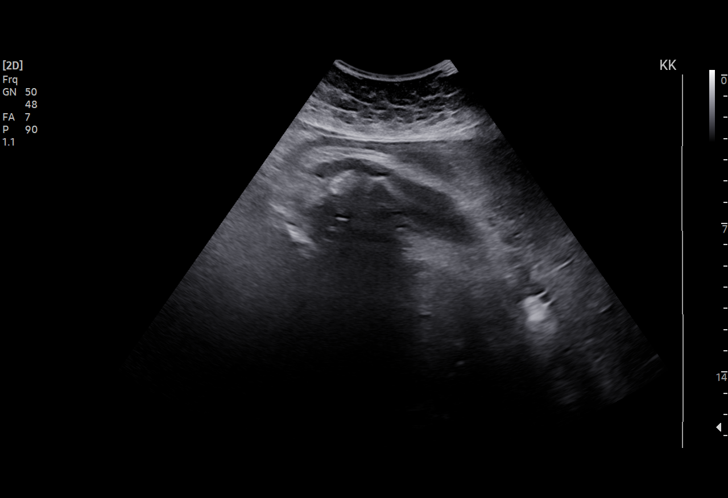
[im 36/51]
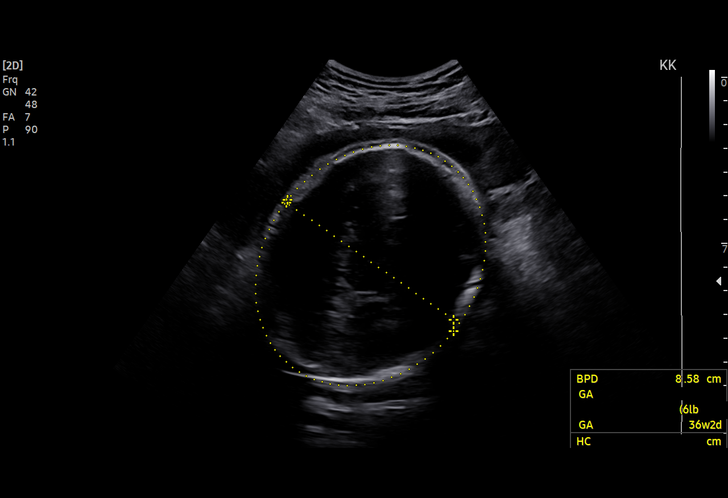
[im 41/51]
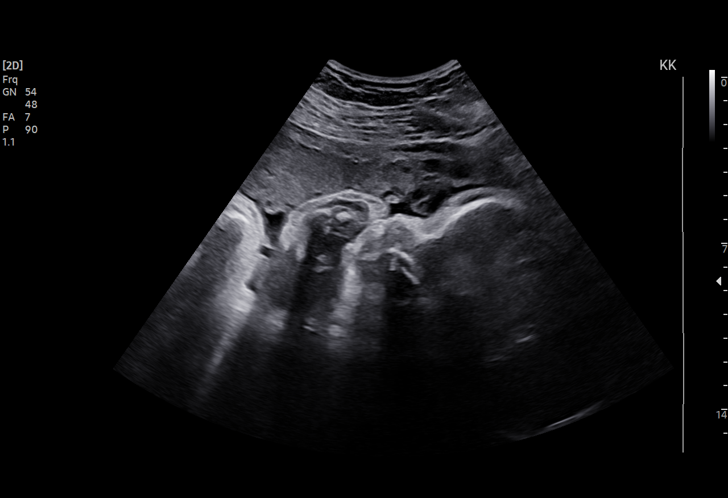
[im 45/51]
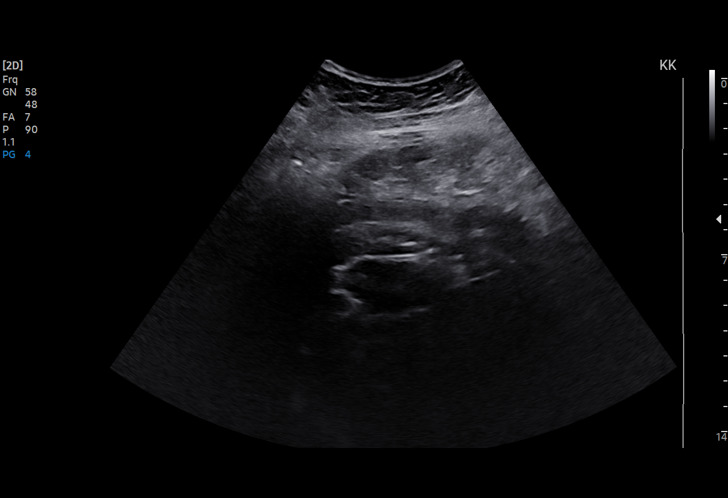
[im 49/51]
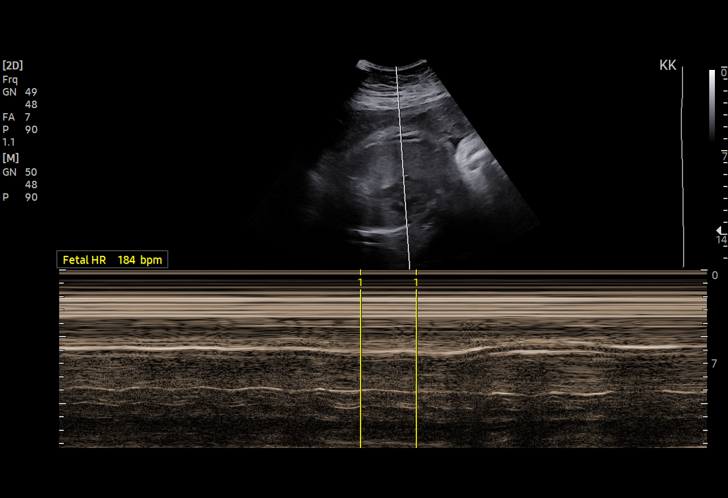

[12 of 28 positions shown; findings below may reference images not displayed]

Obstetrics &
                                                            Gynecology
                                                            [id number]
                                                            [place].

                                                      [place]

Indications

 36 weeks gestation of pregnancy
 Hypertension - Gestational
 Obesity complicating pregnancy, third
 trimester (BMI 40)
 Gestational diabetes in pregnancy,
 controlled by oral hypoglycemic drugs
 Cervical cerclage suture present, third
 trimester
 Advanced maternal age multigravida 35+,
 third trimester
 History of sickle cell trait
 Poor obstetric history: Previous midtrimester
 loss (25 week [place] at home)
 Encounter for other antenatal screening
 follow-up
Fetal Evaluation
 Num Of Fetuses:         1
 Fetal Heart Rate(bpm):  158
 Cardiac Activity:       Observed
 Presentation:           Cephalic
 Placenta:               Anterior
 P. Cord Insertion:      Previously Visualized

 Amniotic Fluid
 AFI FV:      Subjectively low-normal

 AFI Sum(cm)     %Tile       Largest Pocket(cm)
 6.3             < 3         3.

 RUQ(cm)                     LUQ(cm)        LLQ(cm)
 3                           1
Biophysical Evaluation

 Amniotic F.V:   Pocket => 2 cm             F. Tone:        Observed
 F. Movement:    Observed                   Score:          [DATE]
 F. Breathing:   Observed
Biometry

 BPD:      85.6  mm     G. Age:  34w 4d          9  %    CI:        76.23   %    70 - 86
                                                         FL/HC:      23.1   %    20.8 -
 HC:      310.7  mm     G. Age:  34w 5d        1.7  %    HC/AC:      0.96        0.92 -
 AC:      325.2  mm     G. Age:  36w 3d         55  %    FL/BPD:     83.9   %    71 - 87
 FL:       71.8  mm     G. Age:  36w 5d         49  %    FL/AC:      22.1   %    20 - 24

 Est. FW:    7727  gm      6 lb 5 oz     38  %
OB History

 Gravidity:    2         Term:   0        Prem:   1
 Living:       0
Gestational Age

 LMP:           38w 2d        Date:  08/01/21                   EDD:   05/08/22
 U/S Today:     35w 4d                                        EDD:   05/27/22
 Best:          36w 5d     Det. By:  U/S  (12/15/21)          EDD:   05/19/22
Anatomy

 Cranium:               Previously seen        LVOT:                   Previously seen
 Cavum:                 Previously seen        Aortic Arch:            Previously seen
 Ventricles:            Previously seen        Ductal Arch:            Previously seen
 Choroid Plexus:        Previously seen        Diaphragm:              Appears normal
 Cerebellum:            Previously seen        Stomach:                Appears normal, left
                                                                       sided
 Posterior Fossa:       Previously seen        Abdomen:                Previously seen
 Nuchal Fold:           Not applicable (>20    Abdominal Wall:         Previously seen
                        wks GA)
 Face:                  Orbits and profile     Cord Vessels:           Previously seen
                        previously seen
 Lips:                  Previously seen        Kidneys:                Appear normal
 Palate:                Not well visualized    Bladder:                Appears normal
 Thoracic:              Appears normal         Spine:                  Previously seen
 Heart:                 Appears normal         Upper Extremities:      Previously seen
                        (4CH, axis, and
                        situs)
 RVOT:                  Previously seen        Lower Extremities:      Previously seen

 Other:  Male gender previously seen. VC, 3VV and 3VTV  previously
         visualized. Technically difficult due to maternal habitus, advanced
         gestation age, and fetal position.
Cervix Uterus Adnexa

 Cervix
 Not visualized (advanced GA >02wks)
Impression

 Gestational diabetes.  Patient takes metformin for control.
 Gestational hypertension.  Her blood pressures today at her
 office were 151/77 and 136/77 mmHg.  She does not have
 signs and symptoms of severe features of preeclampsia.
 Patient had cerclage removed today at your office.
 Fetal growth is appropriate for gestational age.  Amniotic fluid
 is decreased for this gestational age (no oligohydramnios).
 Antenatal testing is reassuring.  BPP [DATE].  Cephalic
 presentation.
 Patient will be undergoing induction of labor tonight.
                Khn, Nobo

## 2024-05-21 ENCOUNTER — Ambulatory Visit
Admission: RE | Admit: 2024-05-21 | Discharge: 2024-05-21 | Disposition: A | Discharge status: Home / Self Care | Source: Ambulatory Visit | Attending: Family Medicine | Admitting: Family Medicine

## 2024-05-21 ENCOUNTER — Other Ambulatory Visit: Payer: Self-pay

## 2024-05-21 ENCOUNTER — Ambulatory Visit: Payer: Self-pay | Admitting: Nurse Practitioner

## 2024-05-21 ENCOUNTER — Ambulatory Visit (INDEPENDENT_AMBULATORY_CARE_PROVIDER_SITE_OTHER)

## 2024-05-21 VITALS — BP 157/96 | HR 80 | Temp 98.8°F | Resp 17

## 2024-05-21 DIAGNOSIS — S93501A Unspecified sprain of right great toe, initial encounter: Secondary | ICD-10-CM

## 2024-05-21 DIAGNOSIS — M79671 Pain in right foot: Secondary | ICD-10-CM

## 2024-05-21 NOTE — ED Provider Notes (Signed)
 [facility]    CSN: [id number] Arrival date & time: 05/21/24  1150      History   Chief Complaint Chief Complaint  Patient presents with   Toe Injury    Entered by patient    HPI Dawn Vaughn is a 43 y.o. female presents for foot pain.  Patient reports 2 days ago while walking she excellently rolled her right great toe and part of her foot.  States she was able to bear weight after the incident but continues to have pain with weightbearing.  Does endorse some bruising to the base of the toe but no numbness tingling or swelling.  Reports history of hairline fractures in high school to the right foot secondary to traumatic but denies any surgeries.  She has been treating with ibuprofen .  No other injuries or concerns at this time.  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   Chronic hypertension with superimposed pre-eclampsia 04/06/2022   Gestational diabetes mellitus, class A2 Apr 06, 2022   Maternal obesity syndrome, antepartum, third trimester 06-Apr-2022   Antiphospholipid syndrome complicating pregnancy, antepartum (HCC) 04/06/2022   AMA (advanced maternal age) multigravida 35+ 04/06/22   H/O intrauterine fetal death, currently pregnant 2022/04/06   Sickle cell trait (HCC) 12/15/2021    Past Surgical History:  Procedure Laterality Date   CERVICAL CERCLAGE Bilateral 11/19/2021   Procedure: CERCLAGE CERVICAL;  Surgeon: Henry Slough, MD;  Location: [facility]S;  Service: Gynecology;  Laterality: Bilateral;   DILATION AND CURETTAGE OF UTERUS     ORIF ULNAR FRACTURE Right 09/20/2022   Procedure: RIGHT ELBOW PROXIMAL ULNA STRESS FRACTURE FIXATION;  Surgeon: Addie Cordella Hamilton, MD;  Location: [facility] OR;  Service: Orthopedics;   Laterality: Right;    OB History     Gravida  2   Para  1   Term      Preterm  1   AB      Living  0      SAB      IAB      Ectopic      Multiple      Live Births  0        Obstetric Comments  With first did not know she was preg, was still having periods every month; D&C for retained placenta          Home Medications    Prior to Admission medications   Medication Sig Start Date End Date Taking? Authorizing Provider  acetaminophen  (TYLENOL ) 325 MG tablet Take 2 tablets (650 mg total) by mouth every 4 (four) hours as needed (for pain scale < 4). 04/30/22   Pinn, Walda, MD  Cholecalciferol (VITAMIN D ) 125 MCG (5000 UT) CAPS Take 1 capsule by mouth daily. 08/23/22   Magnant, Charles L, PA-C  etonogestrel (NEXPLANON) 68 MG IMPL implant 1 each by Subdermal route once.    [provider]  fluticasone  (FLONASE ) 50 MCG/ACT nasal spray Place 1 spray into both nostrils daily. 01/26/23   Amaris Delafuente, Jodi R, NP  ibuprofen  (ADVIL ) 800 MG tablet Take 1 tablet (800 mg total) by mouth every 8 (eight) hours as needed. 09/30/22   Magnant, Charles L, PA-C  methocarbamol  (ROBAXIN ) 500 MG tablet Take 1  tablet (500 mg total) by mouth every 8 (eight) hours as needed for muscle spasms. 09/20/22   Addie Cordella Hamilton, MD  oxyCODONE  (OXY IR/ROXICODONE ) 5 MG immediate release tablet Take 1 tablet (5 mg total) by mouth every 4 (four) hours as needed for severe pain. 09/20/22   Addie Cordella Hamilton, MD    Family History Family History  Problem Relation Age of Onset   Obesity Mother    Kidney disease Mother    Hypertension Mother    Diabetes Mother    Heart disease Mother    Healthy Father    Early death Son    Cancer Maternal Grandfather     Social History Social History   Tobacco Use   Smoking status: Never   Smokeless tobacco: Never  Vaping Use   Vaping status: Never Used  Substance Use Topics   Alcohol use: Not Currently   Drug use: Never     Allergies   Patient  has no known allergies.   Review of Systems Review of Systems  Musculoskeletal:        Right great toe/foot pain     Physical Exam Triage Vital Signs ED Triage Vitals  Encounter Vitals Group     BP 05/21/24 1223 (!) 157/96     Girls Systolic BP Percentile --      Girls Diastolic BP Percentile --      Boys Systolic BP Percentile --      Boys Diastolic BP Percentile --      Pulse Rate 05/21/24 1223 80     Resp 05/21/24 1223 17     Temp 05/21/24 1223 98.8 F (37.1 C)     Temp Source 05/21/24 1223 Oral     SpO2 05/21/24 1223 98 %     Weight --      Height --      Head Circumference --      Peak Flow --      Pain Score 05/21/24 1222 0     Pain Loc --      Pain Education --      Exclude from Growth Chart --    No data found.  Updated Vital Signs BP (!) 157/96   Pulse 80   Temp 98.8 F (37.1 C) (Oral)   Resp 17   SpO2 98%   Visual Acuity Right Eye Distance:   Left Eye Distance:   Bilateral Distance:    Right Eye Near:   Left Eye Near:    Bilateral Near:     Physical Exam Vitals and nursing note reviewed.  Constitutional:      General: She is not in acute distress.    Appearance: Normal appearance. She is not ill-appearing.  HENT:     Head: Normocephalic and atraumatic.   Eyes:     Pupils: Pupils are equal, round, and reactive to light.    Cardiovascular:     Rate and Rhythm: Normal rate.  Pulmonary:     Effort: Pulmonary effort is normal.   Musculoskeletal:       Feet:  Feet:     Comments: There is no swelling ecchymosis or erythema of the right foot.  Tender to palpation of the distal great toe that extends down the first metatarsal to the medial heel.  DP +2.  Full range of motion of foot with pain on flexion and extension.  No tenderness to ankle.  Skin:    General: Skin is warm and dry.   Neurological:  General: No focal deficit present.     Mental Status: She is alert and oriented to person, place, and time.   Psychiatric:         Mood and Affect: Mood normal.        Behavior: Behavior normal.      UC Treatments / Results  Labs (all labs ordered are listed, but only abnormal results are displayed) Labs Reviewed - No data to display  EKG   Radiology No results found.  Procedures Procedures (including critical care time)  Medications Ordered in UC Medications - No data to display  Initial Impression / Assessment and Plan / UC Course  I have reviewed the triage vital signs and the nursing notes.  Pertinent labs & imaging results that were available during my care of the patient were reviewed by me and considered in my medical decision making (see chart for details).     Reviewed exam and symptoms with patient.  No red flags.  Wet read of x-ray without obvious fracture, will contact for any positive results based on radiology overread once stable.  Discussed toe/foot sprain.  Patient fitted with postop shoe.  Reviewed RICE therapy advised OTC analgesics as needed.  PCP follow-up if symptoms do not improve.  ER precautions reviewed. Final Clinical Impressions(s) / UC Diagnoses   Final diagnoses:  Right foot pain  Sprain of right great toe, initial encounter     Discharge Instructions      You may use the postop shoe to help support your toe/foot.  You may elevate and ice as needed.  Continue over-the-counter Tylenol  or ibuprofen  as needed.  Please follow-up with your PCP if your symptoms do not improve.  Please go to the ER for any worsening symptoms.  Hope you feel better soon!     ED Prescriptions   None    PDMP not reviewed this encounter.   Loreda Myla SAUNDERS, NP 05/21/24 1318

## 2024-05-21 NOTE — Discharge Instructions (Addendum)
 You may use the postop shoe to help support your toe/foot.  You may elevate and ice as needed.  Continue over-the-counter Tylenol  or ibuprofen  as needed.  Please follow-up with your PCP if your symptoms do not improve.  Please go to the ER for any worsening symptoms.  Hope you feel better soon!

## 2024-05-21 NOTE — ED Triage Notes (Signed)
 Pt states she was walking Saturday and her right big toe bend downwards. Pt has black discoloration at the bottom the the toenail.
